# Patient Record
Sex: Female | Born: 1940 | Race: White | Hispanic: No | Marital: Single | Smoking: Never smoker
Health system: Southern US, Community
[De-identification: ages and names within clinical notes are randomized; demographics above are authoritative.]

## PROBLEM LIST (undated history)

## (undated) DIAGNOSIS — F32A Depression, unspecified: Secondary | ICD-10-CM

## (undated) DIAGNOSIS — G4730 Sleep apnea, unspecified: Secondary | ICD-10-CM

## (undated) DIAGNOSIS — R413 Other amnesia: Secondary | ICD-10-CM

## (undated) DIAGNOSIS — E039 Hypothyroidism, unspecified: Secondary | ICD-10-CM

## (undated) DIAGNOSIS — F329 Major depressive disorder, single episode, unspecified: Secondary | ICD-10-CM

## (undated) DIAGNOSIS — M1990 Unspecified osteoarthritis, unspecified site: Secondary | ICD-10-CM

## (undated) DIAGNOSIS — N189 Chronic kidney disease, unspecified: Secondary | ICD-10-CM

## (undated) DIAGNOSIS — R51 Headache: Secondary | ICD-10-CM

## (undated) DIAGNOSIS — I639 Cerebral infarction, unspecified: Secondary | ICD-10-CM

## (undated) DIAGNOSIS — K219 Gastro-esophageal reflux disease without esophagitis: Secondary | ICD-10-CM

## (undated) DIAGNOSIS — F0390 Unspecified dementia without behavioral disturbance: Secondary | ICD-10-CM

## (undated) HISTORY — PX: CARPAL TUNNEL RELEASE: SHX101

## (undated) HISTORY — PX: FOOT SURGERY: SHX648

## (undated) HISTORY — PX: PET ALZHEIMER/DEMENTIA STUDY (ARMC HX): HXRAD1433

## (undated) HISTORY — PX: BACK SURGERY: SHX140

## (undated) HISTORY — PX: ROTATOR CUFF REPAIR: SHX139

---

## 1992-05-14 HISTORY — PX: ABDOMINAL HYSTERECTOMY: SHX81

## 1998-04-13 ENCOUNTER — Ambulatory Visit (HOSPITAL_COMMUNITY): Admission: RE | Admit: 1998-04-13 | Discharge: 1998-04-13 | Payer: Self-pay | Admitting: Orthopedic Surgery

## 1999-08-03 ENCOUNTER — Encounter: Payer: Self-pay | Admitting: Orthopedic Surgery

## 1999-08-10 ENCOUNTER — Ambulatory Visit (HOSPITAL_COMMUNITY): Admission: RE | Admit: 1999-08-10 | Discharge: 1999-08-10 | Payer: Self-pay | Admitting: Orthopedic Surgery

## 2000-10-01 ENCOUNTER — Encounter: Admission: RE | Admit: 2000-10-01 | Discharge: 2000-10-01 | Payer: Self-pay | Admitting: Orthopedic Surgery

## 2000-10-01 ENCOUNTER — Encounter: Payer: Self-pay | Admitting: Orthopedic Surgery

## 2002-11-06 ENCOUNTER — Encounter: Admission: RE | Admit: 2002-11-06 | Discharge: 2002-11-06 | Payer: Self-pay | Admitting: Orthopedic Surgery

## 2002-11-06 ENCOUNTER — Encounter: Payer: Self-pay | Admitting: Orthopedic Surgery

## 2002-12-09 ENCOUNTER — Encounter: Payer: Self-pay | Admitting: Orthopedic Surgery

## 2002-12-17 ENCOUNTER — Encounter: Payer: Self-pay | Admitting: Orthopedic Surgery

## 2002-12-17 ENCOUNTER — Inpatient Hospital Stay (HOSPITAL_COMMUNITY): Admission: RE | Admit: 2002-12-17 | Discharge: 2002-12-18 | Payer: Self-pay | Admitting: Orthopedic Surgery

## 2003-10-12 ENCOUNTER — Ambulatory Visit (HOSPITAL_BASED_OUTPATIENT_CLINIC_OR_DEPARTMENT_OTHER): Admission: RE | Admit: 2003-10-12 | Discharge: 2003-10-12 | Payer: Self-pay | Admitting: Orthopedic Surgery

## 2003-12-30 ENCOUNTER — Encounter: Admission: RE | Admit: 2003-12-30 | Discharge: 2003-12-30 | Payer: Self-pay | Admitting: Orthopedic Surgery

## 2004-02-17 ENCOUNTER — Observation Stay (HOSPITAL_COMMUNITY): Admission: RE | Admit: 2004-02-17 | Discharge: 2004-02-18 | Payer: Self-pay | Admitting: Orthopedic Surgery

## 2004-05-25 ENCOUNTER — Encounter: Admission: RE | Admit: 2004-05-25 | Discharge: 2004-05-25 | Payer: Self-pay | Admitting: Orthopedic Surgery

## 2004-05-27 ENCOUNTER — Encounter: Admission: RE | Admit: 2004-05-27 | Discharge: 2004-05-27 | Payer: Self-pay | Admitting: Orthopedic Surgery

## 2005-10-29 ENCOUNTER — Ambulatory Visit (HOSPITAL_COMMUNITY): Admission: RE | Admit: 2005-10-29 | Discharge: 2005-10-30 | Payer: Self-pay | Admitting: Orthopedic Surgery

## 2006-05-23 ENCOUNTER — Ambulatory Visit (HOSPITAL_COMMUNITY): Admission: RE | Admit: 2006-05-23 | Discharge: 2006-05-24 | Payer: Self-pay | Admitting: Orthopedic Surgery

## 2007-03-27 ENCOUNTER — Encounter: Admission: RE | Admit: 2007-03-27 | Discharge: 2007-03-27 | Payer: Self-pay | Admitting: Orthopedic Surgery

## 2009-05-14 HISTORY — PX: TOE SURGERY: SHX1073

## 2009-07-19 ENCOUNTER — Observation Stay (HOSPITAL_COMMUNITY): Admission: RE | Admit: 2009-07-19 | Discharge: 2009-07-20 | Payer: Self-pay | Admitting: Orthopedic Surgery

## 2010-07-04 ENCOUNTER — Ambulatory Visit (HOSPITAL_BASED_OUTPATIENT_CLINIC_OR_DEPARTMENT_OTHER)
Admission: RE | Admit: 2010-07-04 | Discharge: 2010-07-05 | Disposition: A | Discharge status: Home / Self Care | Payer: Medicare Other | Attending: Orthopedic Surgery | Admitting: Orthopedic Surgery

## 2010-07-04 DIAGNOSIS — M24119 Other articular cartilage disorders, unspecified shoulder: Secondary | ICD-10-CM | POA: Insufficient documentation

## 2010-07-04 DIAGNOSIS — M719 Bursopathy, unspecified: Secondary | ICD-10-CM | POA: Insufficient documentation

## 2010-07-04 DIAGNOSIS — M67919 Unspecified disorder of synovium and tendon, unspecified shoulder: Secondary | ICD-10-CM | POA: Insufficient documentation

## 2010-07-04 DIAGNOSIS — M25819 Other specified joint disorders, unspecified shoulder: Secondary | ICD-10-CM | POA: Insufficient documentation

## 2010-07-07 NOTE — Op Note (Signed)
NAME:  Maria Henry, Maria Henry               ACCOUNT NO.:  [id number]  MEDICAL RECORD NO.:  [id number]          PATIENT TYPE:  AMB  LOCATION:  [facility]                         FACILITY:  [facility]  PHYSICIAN:  Marlowe Kays, M.D.  DATE OF BIRTH:  06-Apr-1941  DATE OF PROCEDURE:  07/04/2010 DATE OF DISCHARGE:                              OPERATIVE REPORT   PREOPERATIVE DIAGNOSIS: 1. Labral tear. 2. Partial rotator cuff tear with impingement from distal clavicle.  POSTOPERATIVE DIAGNOSIS: 1. Labral tear. 2. Partial rotator cuff tear with impingement from distal clavicle.  OPERATION:  Right shoulder arthroscopy with: 1. Debridement of labral tears and minor debridement of biceps tendon. 2. Clearance of the subacromial adhesions with shaving of the rotator     cuff. 3. Distal clavicle decompression.  SURGEON:  Marlowe Kays, M.D.  ASSISTANT:  Mr. Idolina Primer, [facility].  ANESTHESIA:  General.  PLAN AND JUSTIFICATION OF PROCEDURE:  She has a history of years ago having had an open rotator cuff repair by me and recently has developed progressive right shoulder and arm pain with an MRI on April 24, 2010, demonstrating the above-mentioned pathology.  DESCRIPTION OF PROCEDURE:  Satisfied general anesthesia, she had been off her Coumadin now for 5 days and it was felt that her INR of 1.3 was satisfactory for surgery.  This was done yesterday.  Beach-chair position on the sling frame, the right shoulder was prepped with DuraPrep, draped in sterile field.  Anatomy of the shoulder joint was marked out.  Time-out performed.  Through a posterior soft spot portal atraumatically I entered the glenohumeral joint finding some significant fraying of the rotator cuff with fairly long strands impinging into the joint with some minor wear of the long head of biceps tendon.  Rotator cuff on the articular surface appeared to be relatively benign.  I then advanced the scope between the biceps tendon, subscapularis  and using switching stick and making an anterior incision, I placed a metal cannula over the switching stick and then used a 4.2 shaver in the joint debriding down the labral disruption and minimally shaving the long head of biceps tendon.  Pictures were taken.  I then directed the scope in the subacromial space and through a lateral portal introduced a 4.2 shaver.  She had a good bit of scar and I took some time to coordinate both the scope and the 4.2 shaver but this was eventually done.  Old Tycron sutures were noted.  Rotator cuff was basically intact but there was some significant fraying of the surface area and I shaved all this down as well as cleaned out adhesions in the subacromial space.  We then worked medially and we identified the distal clavicle and I used the ArthroCare 90-degree vaporizer to remove the soft tissue from the undersurface of it and pictures confirmed the impingement.  I then used a 4 mm oval bur to bur down the undersurface of the distal clavicle until there was no longer any impingement on the rotator cuff.  Final pictures were taken.  All fluid possible was removed from the subacromial space which was reinjected with 0.5% Marcaine with adrenaline as  well as the 3 portals which were closed with 4-0 nylon. Betadine, Adaptic, dry sterile dressing shoulder immobilizer applied. She tolerated the procedure well.  At time of dictation she was on her way to recovery room in satisfactory condition with no known complications.          ______________________________ Marlowe Kays, M.D.     JA/MEDQ  D:  07/04/2010  T:  07/04/2010  Job:  [id number]  Electronically Signed by Marlowe Kays M.D. on 07/07/2010 04:25:42 PM

## 2010-08-07 LAB — COMPREHENSIVE METABOLIC PANEL
ALT: 24 U/L (ref 0–35)
Albumin: 4 g/dL (ref 3.5–5.2)
CO2: 30 mEq/L (ref 19–32)
Chloride: 104 mEq/L (ref 96–112)
GFR calc non Af Amer: 38 mL/min — ABNORMAL LOW (ref >60)
Glucose, Bld: 75 mg/dL (ref 70–99)
Total Bilirubin: 0.9 mg/dL (ref 0.3–1.2)

## 2010-08-07 LAB — PROTIME-INR
INR: 1.03 (ref 0.00–1.49)
INR: 1.95 — ABNORMAL HIGH (ref 0.00–1.49)
Prothrombin Time: 13.4 seconds (ref 11.6–15.2)
Prothrombin Time: 22.1 seconds — ABNORMAL HIGH (ref 11.6–15.2)

## 2010-08-07 LAB — APTT: aPTT: 29 seconds (ref 24–37)

## 2010-09-29 NOTE — Op Note (Signed)
NAME:  Maria Henry, Maria Henry               ACCOUNT NO.:  [id number]   MEDICAL RECORD NO.:  [id number]          PATIENT TYPE:  AMB   LOCATION:  DAY                          FACILITY:  [facility]   PHYSICIAN:  Marlowe Kays, M.D.  DATE OF BIRTH:  July 26, 1940   DATE OF PROCEDURE:  02/17/2004  DATE OF DISCHARGE:                                 OPERATIVE REPORT   PREOPERATIVE DIAGNOSIS:  Small recurrent rotator cuff tear right shoulder.   POSTOPERATIVE DIAGNOSIS:  Chronic impingement syndrome, with small recurrent  rotator cuff tear right shoulder.   OPERATIONS:  1.  Right shoulder arthroscopy (normal examination).  2.  Arthroscopic subacromial resection, as well as resection of distal      inferior clavicle.   SURGEON:  Marlowe Kays, M.D.   ASSISTANTDruscilla Brownie. Idolina Primer, P.A.-C.   ANESTHESIA:  General.   PATHOLOGIST:  __________ .   INDICATIONS FOR PROCEDURE:  I originally performed an open rotator cuff  repair in 1997.  She recently has developed progressive pain and some  weakness in the shoulder.  We had previously had an MRI which was affected  by metallic artifact, and arthrogram has demonstrated a small amount of dye  only on post-exercise films going to the subacromial space.  It was  explained to her preoperatively that we would try and do everything  arthroscopically, and hopefully with a small tear that we would find some  residual impingement, and that relieving this would allow the tear to heal.  See operative description below for additional details of pathology.   PROCEDURE:  Satisfactory general anesthesia, beach chair position on the  sliding frame.  Right shoulder girdle was prepped with Duraprep, draped in a  sterile field.  The shoulder joint was marked out.  Lateral, posterior, soft  spot, and subacromial areas were all infiltrated with 0.5% Marcaine with  adrenaline.  Through a posterior soft spot portal, I atraumatically entered  the glenohumeral joint.  This  was normal on examination.  Representative  pictures were taken.  In particular, the undersurface of the rotator cuff  appeared to be intact.  I then redirected the scope to the subacromial area.  There were adhesions present, stirring up a little bleeding, which I  corrected after removing many of the adhesions with the 4.2 shaver with the  ArthroCare vaporizer.  On inspecting the subacromial joint, there was a  projection of bone over the anterior leading edge of the acromion, but  perhaps the most prominent spurring was of the distal inferior clavicle.  After using the vaporizer to clear soft tissue from these areas, I then used  a 4.0 oval bur to smooth all this down.  I then debrided it off the rotator  cuff with a 4.2 shaver.  At the conclusion of the case, we could not find  any rotator cuff tear, and there was complete release of any impingement  from the distal clavicle and the anterior acromion.  Representative pictures  were taken with the arm to her side and the arm abducted.  Also, subacromial  bleeding was corrected at the time we  completed the case.  All fluid  possible was removed.  The two portals were closed with interrupted 4-0  nylon and reinjected with the Marcaine with adrenaline,  as well as the subacromial space.  __________  Adaptic, dry sterile  dressing, and shoulder immobilizer were applied.  She tolerated the  procedure well and was taken to the recovery room in satisfactory condition  without complications.      JA/MEDQ  D:  02/17/2004  T:  02/17/2004  Job:  [id number]

## 2010-09-29 NOTE — H&P (Signed)
NAME:  Maria Henry, Maria Henry                         ACCOUNT NO.:  [id number]   MEDICAL RECORD NO.:  [id number]                   PATIENT TYPE:  INP   LOCATION:  NA                                   FACILITY:  [facility]   PHYSICIAN:  Marlowe Kays, M.D.               DATE OF BIRTH:  [birth year]   DATE OF ADMISSION:  12/17/2002  DATE OF DISCHARGE:                                HISTORY & PHYSICAL   CHIEF COMPLAINT:  Pain in my back and leg.   HISTORY OF PRESENT ILLNESS:  [name] had been seen by  [name] for continuing progressive problems concerning pain in her low  back.  She has had numbness and tingling in both legs with jerking in her  legs.  It was thought that perhaps this may be a foot problem.  [name],  a podiatrist, made orthotics for her feet but unfortunately, she has not  been relieved with her discomfort.  She had a high indication of spinal  stenosis on examination.   A myelogram was performed at [facility] and results  showed a soft disk protrusion at L4-5 which results in mild canal stenosis  and bilateral L5 nerve encroachment, worse on the left.  Stenotic changes  were seen as well at L3-L4.  After much discussion and the fact that this  patient is a relatively young lady who is finding her pain interfering with  her daily activities, it was decided to go ahead with central decompression  with lumbar laminectomy  L3-L4 and L4-L5.   PAST MEDICAL HISTORY:  This lady's physician is Dr. Daryll Brod of [facility] in [facility] [place].  The patient has some elevated liver enzymes  preoperatively.  Dr. Daryll Brod noted that she was on Lipitor and will follow  up with that.   ALLERGIES:  The patient has no medical allergies.   CURRENT MEDICATIONS:  1. Prilosec 20 mg one daily.  2. Effexor XR 150 mg daily.  3. Lipitor 20 mg daily.  4. She had been on Coumadin 5 mg daily secondary to history of CVAs without  sequelae and has stopped it five days prior to surgery.  5. Vioxx 50 mg daily.  6. __________.  7. Folic acid 1 mg daily.  8. Calcium.  9. Extra strength Tylenol p.r.n.  10.      Centrum.  11.      Glucosamine chondroitin.  12.      She is hypothyroid and takes Synthroid.   Her stroke occurred in February 1997.  She has no sequelae and no residual  problem.  She has anxiety/depression which is treated with Effexor.  She has  a questionable hiatal hernia for which she takes the Prilosec.  She has  urinary incontinence on stress.  Her thyroid oblation was done some years  ago.   PAST SURGICAL HISTORY:  1. Hysterectomy in  1994.  2. Carpal tunnel bilaterally in the past.  3. Bilateral rotator cuff repairs.  4. Hammer toe corrections in bilateral feet.  5. Arthroscopy of the left knee.   FAMILY HISTORY:  Positive for heart disease in the mother and hypertension  seen in the mother, sister, and brother.  Her sister has diabetes.  Family  history of lung and stomach cancer.  Mother also had a history of CVAs as  well as arthritis.   SOCIAL HISTORY:  The patient is single.  Has a strong family support group.  She is an Programmer, systems.  She has no intake of alcohol or tobacco products.  Lives in a two story home.  Her family physician is Dr. Daryll Brod of  [facility], [place].   REVIEW OF SYSTEMS:  CENTRAL NERVOUS SYSTEM:  No seizures, shoulder  paralysis, numbness, double vision other than in present illness with  numbness in the lower extremities.  CARDIOVASCULAR:  No chest pain.  No  angina.  No orthopnea.  PULMONARY:  No productive cough.  No hemoptysis.  No  shortness of breath.  GASTROINTESTINAL:  No nausea, vomiting, melena, bloody  stools.  GENITOURINARY:  No discharge, dysuria, hematuria.  She does have  stress incontinence.  MUSCULOSKELETAL:  Primarily in present illness.   PHYSICAL EXAMINATION:  GENERAL:  Alert, cooperative, friendly 70 year old  white  female who is accompanied by her sisters and grandson.  VITAL SIGNS:  Blood pressure 120/68, pulse 62, respirations 12.  HEENT:  Normocephalic.  PERRLA.  EOM intact.  Oropharynx is clear.  CHEST:  Clear to auscultation.  No rhonchi.  No rales.  HEART:  Regular rate and rhythm.  No murmurs are heard.  ABDOMEN:  Slightly obese, soft, nontender.  Liver, spleen not felt.  GENITALIA, RECTAL, PELVIC, and BREASTS:  Not done.  Not pertinent to present  illness.  EXTREMITIES:  There are no deformities.  There is a generalized numbness in  the lower extremities.   ADMITTING DIAGNOSES:  1. Spinal stenosis L4-L5 with disk protrusion at L3-L4.  2. Chronic depression.  3. History of cerebrovascular accident on Coumadin therapy.  4. Questionable hiatal hernia.  5. Urinary incontinence.  6. Hypothyroidism secondary to thyroid oblation.   PLAN:  The patient will undergo central decompressive lumbar laminectomy at  L3-L4 and L4-L5.  Dr. Darrelyn Hillock has planned to assist.  Should we have any  medical problems, we will certainly ask one of our internal medicine  physicians here in [facility] to consult with [place].  Per the fax from Dr.  Daryll Brod, he is going to discontinue Lipitor and follow after surgery.     Lottie Dawson, P.A.-C              Marlowe Kays, M.D.   DLU/MEDQ  D:  12/16/2002  T:  12/16/2002  Job:  [id number]   cc:   Elana Alm., M.D., [facility]

## 2010-09-29 NOTE — Op Note (Signed)
NAME:  Maria Henry, Maria Henry                         ACCOUNT NO.:  [id number]   MEDICAL RECORD NO.:  [id number]                   PATIENT TYPE:  INP   LOCATION:  NA                                   FACILITY:  [facility]   PHYSICIAN:  Marlowe Kays, M.D.               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   OPERATION:  Central decompressive laminectomy L3-4, L4-5 and L5-S1 right.   SURGEON:  Marlowe Kays, M.D.   ASSISTANT:  Georges Lynch. Darrelyn Hillock, M.D.   ANESTHESIA:  General.   PATHOLOGY AND JUSTIFICATION FOR PROCEDURE:  Back and bilateral leg pain with  tingling in both legs. Myelogram CT scan showing some bulging disks at both  L3-4 and L4-5 but no herniation. She has 4 mm of instability at L4-5 of her  degenerative spondylolisthesis on flexion extension films but at age 25 and  with health somewhat fragile it was not felt that a fusion would immediately  be indicated and that the above mentioned surgery was preferred. The plan  was since she had spinal stenosis on the myelogram at L3-4 and L4-5 as well  as poor filling S1 nerve root on the right to preform the above mentioned  surgery.   DESCRIPTION OF PROCEDURE:  She had been taken off her Coumadin and was felt  ready for surgery. Prophylactic antibiotics, satisfactory general  anesthesia, Foley catheter inserted, knee chest position on the Andrews  frame, back was prepped with duraprep, draped in a sterile field. With three  spinal needles and a lateral x-ray, I was able to determine initial point of  incision and after  isolating the two spinous processes which we thought were  L3, L4 and L5, I clamped what I thought was L4 and L5 with Kocher clamps and  took a second lateral x-ray confirming that we were indeed in L4 and L5. I  then continued the dissection removing soft tissue from L3 down pass L5 on  the right and placed self retaining McCullough retractors. With the double  action rongeur, I removed the spinous process of L4 and a portion of L5 as  well as inferior portion of L3. Continued removing the neural arch of L4 but  found that she had dense adhesion of ligamentum flavum through the dura at  the L4-5 interval and consequently moved up to L3-4 and I was able to work  carefully with Kerrison rongeurs removing central bone and then working  laterally working up underneath L3 until we had thoroughly decompressed the  dura and then continued distally. At this point, we brought in the  microscope to improve our visualization and because of concern we had with  the ligamentum flavum being so adherent to the dura. Concerns were well  founded working laterally and distally on both sides. We left the central  portion at L4-5 because of adhesions until the last bit. Decompressed on the  right both the L5 and S1 nerve roots and on the left the L5 nerve root. We  were gradually able to tease ligamentum flavum off  the dura. There was a  membranous type stalk which we sectioned well pass the dura and removed the  remaining portion of ligamentum flavum without any injury to the dura. At  the conclusion of the case, the decompressed area was felt to be thoroughly  decompressed with iatrogenic injury.  The wound was irrigated well with  sterile saline and dura covered with Gelfoam. I then placed a 1/4 inch drain  through the left posterior flank and carefully reapproximated the wound  around it with interrupted #1 Vicryl in the fascia and deep subcutaneous  tissue, 2-0 Vicryl in the superficial subcutaneous  tissue and staples in the  skin. Betadine Adaptic dry sterile dressing were applied. She tolerated the  procedure well and was taken to the recovery room in satisfactory condition  with no known complications. Estimated blood loss was approximately 300 mL,  no blood replacement.                                               Marlowe Kays, M.D.    JA/MEDQ  D:  12/17/2002  T:  12/17/2002  Job:  [id number]

## 2010-09-29 NOTE — Op Note (Signed)
NAME:  Maria Henry, Maria Henry               ACCOUNT NO.:  [id number]   MEDICAL RECORD NO.:  [id number]          PATIENT TYPE:  AMB   LOCATION:  DAY                          FACILITY:  [facility]   PHYSICIAN:  Marlowe Kays, M.D.  DATE OF BIRTH:  18-May-1940   DATE OF PROCEDURE:  05/23/2006  DATE OF DISCHARGE:                               OPERATIVE REPORT   PREOPERATIVE DIAGNOSIS:  Painful anterior medial right ankle secondary  to impingement of the talus and medial malleolus with possible old  fracture deformity of tip of medial malleolus.   POSTOPERATIVE DIAGNOSIS:  Painful anterior medial right ankle secondary  to impingement of the talus and medial malleolus with possible old  fracture deformity of tip of medial malleolus.   OPERATION:  Medial ankle arthrotomy with debridement of adjacent sides  of the medial malleolus and talus.   SURGEON:  Marlowe Kays, M.D.   ASSISTANTDruscilla Brownie. Cherlynn June.   ANESTHESIA:  General.   PATHOLOGY AND JUSTIFICATION FOR PROCEDURE:  She has been very physically  active in past years.  Denies any known fracture to her ankle.  Over the  last several months has had pain which localizes to the anterior medial  ankle joint.  A standing AP x-ray of both ankles demonstrates a slight  similar deformity in the left ankle but not nearly as pronounced in the  right ankle.  She has a curved tip of the medial malleolus digging into  the talus, which has the appearance of an old malunion or fibrous union  of fracture.  I tried injecting her in area of point tenderness with  steroid and Xylocaine with temporary relief only from her symptoms, but  the Xylocaine did relieve her symptoms and she is here today for the  above-mentioned surgery.   PROCEDURE:  Satisfactory general anesthesia, pneumatic tourniquet, right  lower extremity was esmarched out nonsterilely and prepped from midcalf  to toes with DuraPrep and draped in a sterile field.  I made a vertical  anterior medial joint incision and protecting neurovascular structures  entered down through the deltoid ligament into the joint.  I carried the  incision distally to observe the posterior tibial tendon, which was  protected.  There appeared to be a small aperture in the canal for the  posterior tibial tendon with a good bit of fluid in the interval between  the talus and the medial malleolus, which was abnormal in amount but not  in appearance.  This seemed to confirm chronic irritation in this area.  With a combination of curette, small rongeur and Freer elevator, I began  clearing out the interval between the talus and the inner aspect of the  medial malleolus, working back into the posterior joint on direct  visualization.  She then had a wide opening between the two bones.  I  took a confirmatory C-arm AP x-ray, which showed wide clearance.  The  wound was then irrigated with sterile saline.  The deltoid ligament  capsular layer was closed with interrupted #1 Vicryl after injecting the  joint and soft tissues with 0.5% plain Marcaine.  I then continued to  close with interrupted 2-0 Vicryl subcutaneous tissue and interrupted 4-  0 nylon mattress sutures in the  skin.  Betadine, Adaptic dry sterile dressing and short leg splint cast  was applied.  The tourniquet was released.  She tolerated the procedure  well was taken to the recovery room in satisfactory condition with no  known complications.           ______________________________  Marlowe Kays, M.D.     JA/MEDQ  D:  05/23/2006  T:  05/23/2006  Job:  [id number]

## 2010-09-29 NOTE — Op Note (Signed)
NAME:  Maria Henry, Maria Henry               ACCOUNT NO.:  [id number]   MEDICAL RECORD NO.:  [id number]          PATIENT TYPE:  INP   LOCATION:  1610                         FACILITY:  [facility]   PHYSICIAN:  Marlowe Kays, M.D.  DATE OF BIRTH:  August 06, 1940   DATE OF PROCEDURE:  10/29/2005  DATE OF DISCHARGE:                                 OPERATIVE REPORT   PREOPERATIVE DIAGNOSES:  1.  Torn medial lateral menisci.  2.  Osteoarthritis left knee.   POSTOPERATIVE DIAGNOSES:  1.  Torn medial lateral menisci.  2.  Osteoarthritis left knee.   OPERATION:  Left knee arthroscopy with partial medial meniscectomy and  debridement of medial lateral femoral condyle.   ANESTHESIA:  General.   FINDINGS UNDER ANESTHESIA:  Painful left knee with x-rays demonstrating  degenerative arthritis but MRI demonstrating anterior horn tear of the  lateral meniscus and a posterior horn tear of the medial meniscus.  These  findings were confirmed at surgery.   PROCEDURE:  She had been taken off her Coumadin will be covered  postoperatively with Lovenox, also given prophylactic antibiotics, pneumatic  tourniquet applied and the left leg Esmarched out nonsterilely.  A thigh  stabilizer applied.  Ace wrap and knee support to right knee.  The left leg  was prepped from tourniquet to ankle with DuraPrep and draped as a sterile  field with superior medial saline inflow.  First an anterolateral portal  medial compartment joint was evaluated.  She had a severe macerated tear of  the entire anterior third to half of the lateral meniscus.  I managed this  mainly with scissors and 3.5 shaver cutting out the bucket-handle type tear  and the flap created by resection and then smoothing the meniscus down with  mini basket and 3.5 shaver until the remaining meniscus was stable on  probing.  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  The lateral tibial plateau also had  wear  but did not require shaving.  Looking at the lateral suprapatellar area she  had some wear of her patella but nothing that was able to be shaved.  I then  reversed portals.  She had full-thickness wear of the medial tibial plateau  and grade 2 to 3/4 wear of the medial femoral condyle which I smoothed down.  There was a wrinkle of the posterior curve which on first blush appeared to  be a tear but on straightening out the meniscus with a probe this was found  be a fold.  She did have a tear of the intercondylar area of the meniscus,  however, where there was almost no room available for working.  I used a  smallest baskets possible to resect this torn area and tried to shave it  down to smooth as much as possible with the 3.5 shaver.  The knee joint was  then irrigated to clear and all fluid possible removed.  The ports were  closed with 4-0 nylon, 20 mL  of 0.5% Marcaine  with adrenalin but no morphine was then instilled through the  inflow  apparatus which was then closed with 4-0 nylon as well.  Betadine and  dressing were applied.  Tourniquet was released.  At the time of this  dictation she was on her way to the recovery room in satisfactory condition  with no complications.           ______________________________  Marlowe Kays, M.D.     JA/MEDQ  D:  10/29/2005  T:  10/30/2005  Job:  [id number]

## 2010-09-29 NOTE — Op Note (Signed)
NAME:  Maria Henry, Maria Henry                         ACCOUNT NO.:  [id number]   MEDICAL RECORD NO.:  [id number]                   PATIENT TYPE:  AMB   LOCATION:  [facility]                                 FACILITY:  [facility]   PHYSICIAN:  Marlowe Kays, M.D.               DATE OF BIRTH:  30-Jun-1940   DATE OF PROCEDURE:  10/12/2003  DATE OF DISCHARGE:                                 OPERATIVE REPORT   PREOPERATIVE DIAGNOSIS:  Possible torn medial meniscus and degenerative  arthritis left knee.   POSTOPERATIVE DIAGNOSES:  1. Torn medial and lateral menisci.  2. Degenerative arthritis of the left knee.   OPERATION PERFORMED:  Left knee arthroscopy with (1) partial medial and  lateral meniscectomy, (2) shaving of medial femoral condyle, lateral femoral  condyle lateral tibial plateau.   SURGEON:  Marlowe Kays, M.D.   ASSISTANT:  Nurse.   ANESTHESIA:  General.   INDICATIONS FOR PROCEDURE:  The patient has had a long history of problems  with her left knee.  In 1999 I arthroscoped her left knee.  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  Accordingly,  she is here today for the above mentioned surgery.  It will be determined at  surgery whether or not she has torn menisci.  See operative description  below for pathology.   DESCRIPTION OF PROCEDURE:  Satisfactory general anesthesia, pneumatic  tourniquet.  The leg was Esmarched out nonsterilely and thigh stabilizer  utilized with Ace wrap and protective pad on the right knee.  The left leg  was prepped with DuraPrep and draped  into a sterile field, superomedial  saline inflow.  First through an anterolateral portal medial compartment  knee joint was evaluated.  She had a little bit of roughening of the  anterior third of the medial meniscus which I shaved down.  The medial  femoral condyle really not look terribly worn and only required a little bit  of smoothing  down.  There was no full thickness chondromalacia.  She did,  however, have an area of full thickness wear through the articular cartilage  of the posteromedial tibial plateau and adjacent to that was an area where  she had a serrated type tear almost as if a mouse had eaten out chunks and  also tear into the intercondylar area.  All of this was pictured and taken  back to a stable rim with a combination of baskets and 3.5 shaver.  Once  small area of what appeared to be some persistent potential area for  bleeding I cauterized with the underwater Bovie.  Looking at the medial  gutter and suprapatellar area, the patella showed minimal wear.  I then  reversed portals.  She had a tear of the anterior and lateral portions of  the lateral meniscus which I pictured and shaved on down with a combination  of scissors and the 3.5 shaver.  She had grade 2 to 3/4 chondromalacia of  both the lateral femoral condyle and lateral tibial plateau which I also  smoothed down with the shaver.  The knee joint was then irrigated until  clear and all _________ removed.  The two anterior portals were closed with  4-0 nylon.  20 mL of 0.5%  Marcaine with Adrenalin was instilled through the inflow apparatus which was  removed and this portal closed with 4-0 nylon as well.  Betadine, Adaptic  and dry sterile dressing were applied.  Tourniquet was released.  The  patient tolerated the procedure well and was taken to recovery in  satisfactory condition with no complications.                                               Marlowe Kays, M.D.    JA/MEDQ  D:  10/12/2003  T:   10/12/2003  Job:  [id number]

## 2012-06-18 NOTE — Progress Notes (Signed)
Dr Simonne Come-  Need Pre Op Orders when you can- pt has appt PST 06/23/12  [facility]

## 2012-06-19 ENCOUNTER — Other Ambulatory Visit: Payer: Self-pay | Admitting: Orthopedic Surgery

## 2012-06-19 ENCOUNTER — Encounter (HOSPITAL_COMMUNITY): Payer: Self-pay | Admitting: Pharmacy Technician

## 2012-06-23 ENCOUNTER — Encounter (HOSPITAL_COMMUNITY): Payer: Self-pay

## 2012-06-23 ENCOUNTER — Encounter (HOSPITAL_COMMUNITY)
Admission: RE | Admit: 2012-06-23 | Discharge: 2012-06-23 | Disposition: A | Discharge status: Home / Self Care | Payer: Medicare PPO | Source: Ambulatory Visit | Attending: Orthopedic Surgery | Admitting: Orthopedic Surgery

## 2012-06-23 HISTORY — DX: Headache: R51

## 2012-06-23 HISTORY — DX: Cerebral infarction, unspecified: I63.9

## 2012-06-23 HISTORY — DX: Unspecified osteoarthritis, unspecified site: M19.90

## 2012-06-23 HISTORY — DX: Gastro-esophageal reflux disease without esophagitis: K21.9

## 2012-06-23 HISTORY — DX: Major depressive disorder, single episode, unspecified: F32.9

## 2012-06-23 HISTORY — DX: Hypothyroidism, unspecified: E03.9

## 2012-06-23 HISTORY — DX: Depression, unspecified: F32.A

## 2012-06-23 LAB — CBC
HCT: 40.1 % (ref 36.0–46.0)
Hemoglobin: 13.7 g/dL (ref 12.0–15.0)
MCH: 31.3 pg (ref 26.0–34.0)
Platelets: 302 10*3/uL (ref 150–400)
RBC: 4.38 MIL/uL (ref 3.87–5.11)
RDW: 13 % (ref 11.5–15.5)

## 2012-06-23 LAB — BASIC METABOLIC PANEL
Calcium: 9.8 mg/dL (ref 8.4–10.5)
GFR calc Af Amer: 50 mL/min — ABNORMAL LOW (ref >90)
Potassium: 4.3 mEq/L (ref 3.5–5.1)

## 2012-06-23 LAB — SURGICAL PCR SCREEN: MRSA, PCR: NEGATIVE

## 2012-06-23 NOTE — Patient Instructions (Addendum)
Maria Henry  06/23/2012   Your procedure is scheduled on: 06/26/12  Report to [facility] at 12:00 PM.  Call this number if you have problems the morning of surgery 336-: [phone]   Remember:   Do not eat food After Midnight on 06/25/12 then clear liquids from midnight until 0830am on 06/26/12.     Take these medicines the morning of surgery with A SIP OF WATER: synthroid, paxil, prilosec, tramadol if needed   Do not wear jewelry, make-up or nail polish.  Do not wear lotions, powders, or perfumes. You may wear deodorant.  Do not shave 48 hours prior to surgery. Men may shave face and neck.  Do not bring valuables to the hospital.  Contacts, dentures or bridgework may not be worn into surgery.     Patients discharged the day of surgery will not be allowed to drive home.  Name and phone number of your driver: Dennie Bible [phone]     Please read over the following fact sheets that you were given: MRSA Information.  Birdie Sons, RN  pre op nurse call if needed [phone]    FAILURE TO FOLLOW THESE INSTRUCTIONS MAY RESULT IN CANCELLATION OF YOUR SURGERY   Patient Signature: ___________________________________________

## 2012-06-23 NOTE — Progress Notes (Signed)
Surgery clearance note Dr, Ala Dach on chart

## 2012-06-26 ENCOUNTER — Encounter (HOSPITAL_COMMUNITY): Admission: RE | Payer: Self-pay | Source: Ambulatory Visit

## 2012-06-26 ENCOUNTER — Ambulatory Visit (HOSPITAL_COMMUNITY): Admission: RE | Admit: 2012-06-26 | Payer: Medicare PPO | Source: Ambulatory Visit | Admitting: Orthopedic Surgery

## 2012-06-26 SURGERY — ARTHROSCOPY, KNEE, WITH MEDIAL MENISCECTOMY
Anesthesia: General | Site: Knee | Laterality: Right

## 2012-06-30 NOTE — Progress Notes (Signed)
PT'S SURGERY FOR 06/26/12 WAS CANCELLED DUE TO SNOW--PT NOTIFIED HER SURGERY IS NOW RESCHEDULED FOR TOMORROW  07/01/12 AT 2:30 PM AND SHE IS TO ARRIVE TO SHORT STAY BY 12:00 PM.  NO FOOD AFTER MIDNIGHT TONIGHT--SHE MAY HAVE CLEAR LIQUIDS TO DRINK UNTIL 8:30 AM TOMORROW AND THEN NOTHING AFTER 8:30 AM.  ALL OTHER INSTRUCTIONS THE SAME AS PREVIOUSLY DISCUSSED -INCLUDING ANY MEDICATIONS TO TAKE.

## 2012-07-01 ENCOUNTER — Encounter (HOSPITAL_COMMUNITY): Payer: Self-pay | Admitting: Anesthesiology

## 2012-07-01 ENCOUNTER — Ambulatory Visit (HOSPITAL_COMMUNITY): Payer: Medicare PPO | Admitting: Anesthesiology

## 2012-07-01 ENCOUNTER — Encounter (HOSPITAL_COMMUNITY)
Admission: RE | Disposition: A | Discharge status: Home / Self Care | Payer: Self-pay | Source: Ambulatory Visit | Attending: Orthopedic Surgery

## 2012-07-01 ENCOUNTER — Encounter (HOSPITAL_COMMUNITY): Payer: Self-pay | Admitting: *Deleted

## 2012-07-01 ENCOUNTER — Ambulatory Visit (HOSPITAL_COMMUNITY)
Admission: RE | Admit: 2012-07-01 | Discharge: 2012-07-01 | Disposition: A | Discharge status: Home / Self Care | Payer: Medicare PPO | Source: Ambulatory Visit | Attending: Orthopedic Surgery | Admitting: Orthopedic Surgery

## 2012-07-01 DIAGNOSIS — M1710 Unilateral primary osteoarthritis, unspecified knee: Secondary | ICD-10-CM | POA: Insufficient documentation

## 2012-07-01 DIAGNOSIS — Z01810 Encounter for preprocedural cardiovascular examination: Secondary | ICD-10-CM | POA: Insufficient documentation

## 2012-07-01 DIAGNOSIS — M23305 Other meniscus derangements, unspecified medial meniscus, unspecified knee: Secondary | ICD-10-CM | POA: Insufficient documentation

## 2012-07-01 DIAGNOSIS — M23349 Other meniscus derangements, anterior horn of lateral meniscus, unspecified knee: Secondary | ICD-10-CM | POA: Insufficient documentation

## 2012-07-01 DIAGNOSIS — Z01812 Encounter for preprocedural laboratory examination: Secondary | ICD-10-CM | POA: Insufficient documentation

## 2012-07-01 DIAGNOSIS — Z9889 Other specified postprocedural states: Secondary | ICD-10-CM

## 2012-07-01 DIAGNOSIS — M2340 Loose body in knee, unspecified knee: Secondary | ICD-10-CM | POA: Insufficient documentation

## 2012-07-01 HISTORY — PX: KNEE ARTHROSCOPY: SHX127

## 2012-07-01 SURGERY — ARTHROSCOPY, KNEE
Anesthesia: General | Site: Knee | Laterality: Right | Wound class: Clean

## 2012-07-01 MED ORDER — LIDOCAINE HCL (CARDIAC) 20 MG/ML IV SOLN
INTRAVENOUS | Status: DC | PRN
  Administered 2012-07-01: 50 mg via INTRAVENOUS

## 2012-07-01 MED ORDER — BUPIVACAINE-EPINEPHRINE 0.5% -1:200000 IJ SOLN
INTRAMUSCULAR | Status: DC | PRN
  Administered 2012-07-01: 30 mL

## 2012-07-01 MED ORDER — MORPHINE SULFATE 4 MG/ML IJ SOLN
INTRAMUSCULAR | Status: AC
  Filled 2012-07-01: qty 1

## 2012-07-01 MED ORDER — FENTANYL CITRATE 0.05 MG/ML IJ SOLN: 25.0000 ug | INTRAMUSCULAR | Status: DC | PRN

## 2012-07-01 MED ORDER — LACTATED RINGERS IV SOLN: INTRAVENOUS | Status: DC

## 2012-07-01 MED ORDER — LACTATED RINGERS IR SOLN
Status: DC | PRN
  Administered 2012-07-01: 600 mL

## 2012-07-01 MED ORDER — PROMETHAZINE HCL 25 MG/ML IJ SOLN: 6.2500 mg | INTRAMUSCULAR | Status: DC | PRN

## 2012-07-01 MED ORDER — HYDROCODONE-ACETAMINOPHEN 7.5-325 MG PO TABS
1.0000 | ORAL_TABLET | Freq: Four times a day (QID) | ORAL | Status: DC | PRN
  Administered 2012-07-01: 1 via ORAL

## 2012-07-01 MED ORDER — ONDANSETRON HCL 4 MG/2ML IJ SOLN
INTRAMUSCULAR | Status: DC | PRN
  Administered 2012-07-01: 4 mg via INTRAVENOUS

## 2012-07-01 MED ORDER — HYDROCODONE-ACETAMINOPHEN 7.5-325 MG PO TABS
ORAL_TABLET | ORAL | Status: AC
  Administered 2012-07-01: 1 via ORAL
  Filled 2012-07-01: qty 1

## 2012-07-01 MED ORDER — HYDROCODONE-ACETAMINOPHEN 7.5-325 MG PO TABS: 1.0000 | ORAL_TABLET | Freq: Four times a day (QID) | ORAL | Status: DC | PRN

## 2012-07-01 MED ORDER — FENTANYL CITRATE 0.05 MG/ML IJ SOLN
INTRAMUSCULAR | Status: DC | PRN
  Administered 2012-07-01 (×3): 25 ug via INTRAVENOUS

## 2012-07-01 MED ORDER — MEPERIDINE HCL 50 MG/ML IJ SOLN: 6.2500 mg | INTRAMUSCULAR | Status: DC | PRN

## 2012-07-01 MED ORDER — LACTATED RINGERS IV SOLN
INTRAVENOUS | Status: DC | PRN
  Administered 2012-07-01 (×2): via INTRAVENOUS

## 2012-07-01 MED ORDER — EPINEPHRINE HCL 1 MG/ML IJ SOLN
INTRAMUSCULAR | Status: AC
  Filled 2012-07-01: qty 2

## 2012-07-01 MED ORDER — EPINEPHRINE HCL 1 MG/ML IJ SOLN
INTRAMUSCULAR | Status: DC | PRN
  Administered 2012-07-01: 2 mL

## 2012-07-01 MED ORDER — PROPOFOL 10 MG/ML IV BOLUS
INTRAVENOUS | Status: DC | PRN
  Administered 2012-07-01: 200 mg via INTRAVENOUS

## 2012-07-01 MED ORDER — BUPIVACAINE-EPINEPHRINE 0.5% -1:200000 IJ SOLN
INTRAMUSCULAR | Status: AC
  Filled 2012-07-01: qty 1

## 2012-07-01 MED ORDER — POVIDONE-IODINE 7.5 % EX SOLN: Freq: Once | CUTANEOUS | Status: DC

## 2012-07-01 SURGICAL SUPPLY — 26 items
BANDAGE ELASTIC 6 VELCRO ST LF (GAUZE/BANDAGES/DRESSINGS) ×2 IMPLANT
BLADE 4.2CUDA (BLADE) IMPLANT
BLADE CUDA SHAVER 3.5 (BLADE) ×2 IMPLANT
CLOTH BEACON ORANGE TIMEOUT ST (SAFETY) ×2 IMPLANT
CUFF TOURN SGL QUICK 34 (TOURNIQUET CUFF) ×1
CUFF TRNQT CYL 34X4X40X1 (TOURNIQUET CUFF) ×1 IMPLANT
DRSG EMULSION OIL 3X3 NADH (GAUZE/BANDAGES/DRESSINGS) ×2 IMPLANT
DRSG PAD ABDOMINAL 8X10 ST (GAUZE/BANDAGES/DRESSINGS) ×2 IMPLANT
DURAPREP 26ML APPLICATOR (WOUND CARE) ×2 IMPLANT
ELECT REM PT RETURN 9FT ADLT (ELECTROSURGICAL) ×2
ELECTRODE REM PT RTRN 9FT ADLT (ELECTROSURGICAL) ×1 IMPLANT
GLOVE BIO SURGEON STRL SZ7.5 (GLOVE) ×2 IMPLANT
GLOVE BIO SURGEON STRL SZ8 (GLOVE) ×4 IMPLANT
GLOVE ECLIPSE 8.0 STRL XLNG CF (GLOVE) ×6 IMPLANT
GLOVE INDICATOR 8.0 STRL GRN (GLOVE) ×4 IMPLANT
MANIFOLD NEPTUNE II (INSTRUMENTS) ×4 IMPLANT
PACK ARTHROSCOPY WL (CUSTOM PROCEDURE TRAY) ×2 IMPLANT
PAD CAST 4YDX4 CTTN HI CHSV (CAST SUPPLIES) ×1 IMPLANT
PADDING CAST COTTON 4X4 STRL (CAST SUPPLIES) ×1
POSITIONER SURGICAL ARM (MISCELLANEOUS) ×2 IMPLANT
SET ARTHROSCOPY TUBING (MISCELLANEOUS) ×1
SET ARTHROSCOPY TUBING LN (MISCELLANEOUS) ×1 IMPLANT
SPONGE GAUZE 4X4 12PLY (GAUZE/BANDAGES/DRESSINGS) ×2 IMPLANT
SUT ETHILON 4 0 PS 2 18 (SUTURE) ×2 IMPLANT
WAND 90 DEG TURBOVAC W/CORD (SURGICAL WAND) ×2 IMPLANT
WRAP KNEE MAXI GEL POST OP (GAUZE/BANDAGES/DRESSINGS) ×2 IMPLANT

## 2012-07-01 NOTE — Anesthesia Preprocedure Evaluation (Signed)
Anesthesia Evaluation  Patient identified by MRN, date of birth, ID band Patient awake    Reviewed: Allergy & Precautions, H&P , NPO status , Patient's Chart, lab work & pertinent test results  Airway Mallampati: II TM Distance: >3 FB Neck ROM: Full    Dental no notable dental hx. (+) Edentulous Upper and Edentulous Lower   Pulmonary neg pulmonary ROS,  breath sounds clear to auscultation  Pulmonary exam normal       Cardiovascular negative cardio ROS  Rhythm:Regular Rate:Normal     Neuro/Psych CVA, Residual Symptoms negative neurological ROS  negative psych ROS   GI/Hepatic negative GI ROS, Neg liver ROS, GERD-  Medicated and Controlled,  Endo/Other  negative endocrine ROSHypothyroidism   Renal/GU negative Renal ROS  negative genitourinary   Musculoskeletal negative musculoskeletal ROS (+)   Abdominal   Peds negative pediatric ROS (+)  Hematology negative hematology ROS (+)   Anesthesia Other Findings   Reproductive/Obstetrics negative OB ROS                           Anesthesia Physical Anesthesia Plan  ASA: II  Anesthesia Plan: General   Post-op Pain Management:    Induction: Intravenous  Airway Management Planned: LMA  Additional Equipment:   Intra-op Plan:   Post-operative Plan: Extubation in OR  Informed Consent: I have reviewed the patients History and Physical, chart, labs and discussed the procedure including the risks, benefits and alternatives for the proposed anesthesia with the patient or authorized representative who has indicated his/her understanding and acceptance.   Dental advisory given  Plan Discussed with: CRNA  Anesthesia Plan Comments:         Anesthesia Quick Evaluation

## 2012-07-01 NOTE — Anesthesia Postprocedure Evaluation (Signed)
Anesthesia Post Note  Patient: Maria Henry  Procedure(s) Performed: Procedure(s) (LRB): RIGHT KNEE ARTHROSCOPY WITH PARTIAL MEDIAL/LATERAL MENISECTOMY (Right)  Anesthesia type: General  Patient location: PACU  Post pain: Pain level controlled  Post assessment: Post-op Vital signs reviewed  Last Vitals: BP 141/64  Pulse 77  Temp(Src) 37.1 C  Resp 14  Ht 5\' 1"  (1.549 m)  Wt 177 lb 2 oz (80.343 kg)  BMI 33.48 kg/m2  SpO2 99%  Post vital signs: Reviewed  Level of consciousness: sedated  Complications: No apparent anesthesia complications

## 2012-07-01 NOTE — Transfer of Care (Signed)
Immediate Anesthesia Transfer of Care Note  Patient: Maria Henry  Procedure(s) Performed: Procedure(s) with comments: RIGHT KNEE ARTHROSCOPY WITH PARTIAL MEDIAL/LATERAL MENISECTOMY (Right) - RIGHT KNEE ARTHROSCOPY WITH PARTIAL MEDIAL/LATERAL MENISECTOMY  Patient Location: PACU  Anesthesia Type:General  Level of Consciousness: awake and alert   Airway & Oxygen Therapy: Patient Spontanous Breathing and Patient connected to face mask oxygen  Post-op Assessment: Report given to PACU RN and Post -op Vital signs reviewed and stable  Post vital signs: Reviewed and stable  Complications: No apparent anesthesia complications

## 2012-07-01 NOTE — Anesthesia Postprocedure Evaluation (Signed)
Anesthesia Post Note  Patient: Maria Henry  Procedure(s) Performed: Procedure(s) (LRB): RIGHT KNEE ARTHROSCOPY WITH PARTIAL MEDIAL/LATERAL MENISECTOMY (Right)  Anesthesia type: General  Patient location: PACU  Post pain: Pain level controlled  Post assessment: Post-op Vital signs reviewed  Last Vitals: BP 129/56  Pulse 80  Temp(Src) 37.2 C (Oral)  Resp 16  Ht 5\' 1"  (1.549 m)  Wt 177 lb 2 oz (80.343 kg)  BMI 33.48 kg/m2  SpO2 94%  Post vital signs: Reviewed  Level of consciousness: sedated  Complications: No apparent anesthesia complications

## 2012-07-01 NOTE — Brief Op Note (Signed)
07/01/2012  4:11 PM  PATIENT:  Maria Henry  72 y.o. female  PRE-OPERATIVE DIAGNOSIS:  RIGHT KNEE TORN MEDIAL/LATERAL MENISCUS  POST-OPERATIVE DIAGNOSIS:  right knee torn medial/ lateral meniscus  PROCEDURE:  Procedure(s) with comments: RIGHT KNEE ARTHROSCOPY WITH PARTIAL MEDIAL/LATERAL MENISECTOMY (Right) - RIGHT KNEE ARTHROSCOPY WITH PARTIAL MEDIAL/LATERAL MENISECTOMY  SURGEON:  Surgeon(s) and Role:    * Drucilla Schmidt, MD - Primary  PHYSICIAN ASSISTANT:   ASSISTANTS:nurse  ANESTHESIA:   local and general  EBL:  Total I/O In: 1000 [I.V.:1000] Out: -   BLOOD ADMINISTERED:none  DRAINS: none   LOCAL MEDICATIONS USED:  MARCAINE     SPECIMEN:  No Specimen  DISPOSITION OF SPECIMEN:  N/A  COUNTS:  YES  TOURNIQUET:   Total Tourniquet Time Documented: Thigh (Right) - 58 minutes Total: Thigh (Right) - 58 minutes   DICTATION: .Other Dictation: Dictation Number [id number]  PLAN OF CARE: Discharge to home after PACU  PATIENT DISPOSITION:  PACU - hemodynamically stable.   Delay start of Pharmacological VTE agent (>24hrs) due to surgical blood loss or risk of bleeding: yes

## 2012-07-02 ENCOUNTER — Encounter (HOSPITAL_COMMUNITY): Payer: Self-pay | Admitting: Orthopedic Surgery

## 2012-07-02 NOTE — Op Note (Signed)
NAME:  Maria Henry, Maria Henry               ACCOUNT NO.:  [id number]  MEDICAL RECORD NO.:  [id number]  LOCATION:  [facility]                         FACILITY:  [facility]  PHYSICIAN:  Marlowe Kays, M.D.  DATE OF BIRTH:  21-Mar-1941  DATE OF PROCEDURE:  07/01/2012 DATE OF DISCHARGE:  07/01/2012                              OPERATIVE REPORT   PREOPERATIVE DIAGNOSES: 1. Torn medial and lateral menisci. 2. Osteoarthritis, right knee.  POSTOPERATIVE DIAGNOSES: 1. Torn medial and lateral menisci. 2. Osteoarthritis, right knee.  OPERATION: 1. Right knee arthroscopy with partial medial and lateral     meniscectomies. 2. Shaving of medial femoral condyle.  SURGEON:  Marlowe Kays, M.D.  ASSISTANT:  Nurse.  ANESTHESIA:  General.  PATHOLOGY AND JUSTIFICATION FOR PROCEDURE:  Painful right knee with an MRI demonstrating the preoperative diagnoses.  This was confirmed at Surgery.  PROCEDURE:  Satisfactory general anesthesia, Ace wrap, and knee support to left lower extremity, pneumatic tourniquet to the right lower extremity with the leg Esmarched out non-sterilely.  Tourniquet was inflated to 325 mmHg.  Thigh stabilizer was applied.  Right leg was then prepped with DuraPrep from stabilizer to ankle and draped in sterile field.  Time-out was performed.  Superior medial saline inflow.  First, through an anteromedial portal, the medial compartment knee joint was evaluated anteriorly.  She had good bit of disruption of the medial meniscus as well as some partial exfoliation of the articular cartilage of medial femoral condyle.  With a 3.5 shaver, I smoothed down the anterior medial meniscus with partial synovectomy.  I then shaved down the remainder of the medial meniscus and medial femoral condyle, with pre and post films being taken and was also small loose fragment, which I removed with pituitary rongeur.  I then reversed portals.  Her ACL was intact.  She had a similar picture of disruption of the  anterior third of the lateral meniscus, which I debrided out with the 3.5 shaver.  She also had some wear along most of the inner border of the lateral meniscus, but her lateral femoral condyle looked intact.  After shaving, the lateral meniscus looked up in suprapatellar area, the patella looked normal.  Knee joint was irrigated to clear and all fluid were possibly removed.  I closed the two entry portals with 4-0 nylon and injected 20 mL of 0.5% Marcaine with adrenaline through the inflow apparatus, and I closed this portal with 4-0 nylon as well.  Betadine, Adaptic, and dry sterile dressing were applied.  Tourniquet was released.  She tolerated the procedure well and was taken to the recovery room in satisfactory condition with no known complications.          ______________________________ Marlowe Kays, M.D.     JA/MEDQ  D:  07/01/2012  T:  07/02/2012  Job:  [id number]

## 2012-08-19 ENCOUNTER — Other Ambulatory Visit: Payer: Self-pay | Admitting: Orthopedic Surgery

## 2012-09-02 ENCOUNTER — Encounter (HOSPITAL_COMMUNITY): Payer: Self-pay | Admitting: Pharmacy Technician

## 2012-09-08 ENCOUNTER — Encounter (HOSPITAL_COMMUNITY): Payer: Self-pay

## 2012-09-08 ENCOUNTER — Encounter (HOSPITAL_COMMUNITY)
Admission: RE | Admit: 2012-09-08 | Discharge: 2012-09-08 | Disposition: A | Discharge status: Home / Self Care | Payer: Medicare PPO | Source: Ambulatory Visit | Attending: Orthopedic Surgery | Admitting: Orthopedic Surgery

## 2012-09-08 HISTORY — DX: Other amnesia: R41.3

## 2012-09-08 LAB — BASIC METABOLIC PANEL
BUN: 16 mg/dL (ref 6–23)
Chloride: 106 mEq/L (ref 96–112)
GFR calc Af Amer: 60 mL/min — ABNORMAL LOW (ref >90)
Potassium: 4.1 mEq/L (ref 3.5–5.1)

## 2012-09-08 LAB — CBC
HCT: 35.8 % — ABNORMAL LOW (ref 36.0–46.0)
RDW: 13.8 % (ref 11.5–15.5)
WBC: 6.8 10*3/uL (ref 4.0–10.5)

## 2012-09-08 LAB — APTT: aPTT: 34 seconds (ref 24–37)

## 2012-09-08 LAB — PROTIME-INR: Prothrombin Time: 12 seconds (ref 11.6–15.2)

## 2012-09-08 LAB — SURGICAL PCR SCREEN: MRSA, PCR: NEGATIVE

## 2012-09-08 NOTE — Progress Notes (Signed)
EKG 06/23/12 on EPIC, surgery clearance note 08/14/12 Dr. Ala Dach on chart

## 2012-09-08 NOTE — Patient Instructions (Addendum)
20 ERSEL WADLEIGH  09/08/2012   Your procedure is scheduled on: 09/16/12  Report to [facility] at 0930 AM.  Call this number if you have problems the morning of surgery 336-: [phone]   Remember:   Do not eat food or drink liquids After Midnight.     Take these medicines the morning of surgery with A SIP OF WATER: synthroid, prilosec, paxil   Do not wear jewelry, make-up or nail polish.  Do not wear lotions, powders, or perfumes. You may wear deodorant.  Do not shave 48 hours prior to surgery. Men may shave face and neck.  Do not bring valuables to the hospital.  Contacts, dentures or bridgework may not be worn into surgery.  Leave suitcase in the car. After surgery it may be brought to your room.  For patients admitted to the hospital, checkout time is 11:00 AM the day of discharge.    Please read over the following fact sheets that you were given: MRSA Information, blood fact sheet  Birdie Sons, RN  pre op nurse call if needed [phone]    FAILURE TO FOLLOW THESE INSTRUCTIONS MAY RESULT IN CANCELLATION OF YOUR SURGERY   Patient Signature: ___________________________________________

## 2012-09-09 NOTE — H&P (Signed)
NAME:  Maria Henry, Maria Henry               ACCOUNT NO.:  [id number]  MEDICAL RECORD NO.:  [id number]  LOCATION:                                 FACILITY:  PHYSICIAN:  Marlowe Kays, M.D.  DATE OF BIRTH:  05-08-1941  DATE OF ADMISSION: DATE OF DISCHARGE:                             HISTORY & PHYSICAL   CHIEF COMPLAINT:  Pain in my left knee.  PRESENT ILLNESS:  This is a 72 year old lady has been seen by [place] continued progressive problems concerning her knees.  She underwent right knee arthroscopy and has done very well with that knee but she is having more problems in her left knee.  This was felt to be some internal derangement of the knee.  However, x-rays indicated that she has bone on bone deterioration of the knee with end-stage osteoarthritis, marked degenerative changes in all 3 compartments and there is a shift of the femur, tibia on the medial aspect.  We decided after much discussion, including the risks and benefits of surgery, to do total knee replacement and arthroplasty of the left knee.  The patient is very active.  She is able to get about herself and was felt that if we did not do something about this knee, then she will lapse into a sedentary life.  PAST MEDICAL HISTORY:  The patient has had multiple surgeries with again arthroscopy of the right knee.  She has had done some sort of nasal surgery, shoulder rotator cuff repair, hysterectomy, hammertoe repair.  PRESENT FAMILY HISTORY:  Positive for cancer in the father.  Heart disease in her mother and diabetes in her sister.  SOCIAL HISTORY:  The patient is single.  She is a retired Runner, broadcasting/film/video.  Has no intake of alcohol or tobacco products.  She has a friend to help her after surgery with home health.  MEDICAL ALLERGIES:  None.  MEDICATIONS:  The patient is on tramadol, oxybutynin chloride, fluocinonide, ketoconazole shampoo, lorazepam, raloxifene, and omeprazole.  She is also on Plavix and stop 5 days prior to  surgery.  REVIEW OF SYSTEMS:  CNS:  No seizures, paralysis, numbness, or double vision.  The patient does have a history of stroke in 2002 and 1997. She also has intermittent migraines and she has noticed impaired memory. CARDIOVASCULAR:  No chest pain.  No angina or orthopnea. RESPIRATORY:  No productive cough.  No hemoptysis, shortness of breath. GASTROINTESTINAL:  No nausea, vomiting, or bloody stool. GENITOURINARY:  No dysuria, hematuria.  PHYSICAL EXAMINATION:  GENERAL:  Alert and cooperative friendly.  A 72- year-old, white female, who is fully alert. VITAL SIGNS:  On record. HEENT:  Normocephalic.  PERRLA.  EOM intact.  Oropharynx is clear. CHEST:  Clear to auscultation.  No rhonchi.  No rales. HEART:  Regular rate and rhythm.  No murmurs are heard. ABDOMEN:  Soft, nontender.  Liver and spleen not felt. GENITALIA, RECTAL, PELVIC, BREAST:  Not pertinent to present illness not done. KNEES:  She has very good range of motion in the right knee.  However, left knee with crepitus particularly patellofemoral and medial compartments.  She has some quad atrophy on the left as well, with some mild valgus.  ADMITTING DIAGNOSES: 1.  End-stage osteoarthritis of the left knee. 2. History of stroke. 3. Hypertension.     Maria Henry.   ______________________________ Marlowe Kays, M.D.    DLU/MEDQ  D:  09/08/2012  T:  09/09/2012  Job:  [id number]

## 2012-09-16 ENCOUNTER — Encounter (HOSPITAL_COMMUNITY)
Admission: RE | Disposition: A | Discharge status: Skilled Nursing Facility | Payer: Self-pay | Source: Ambulatory Visit | Attending: Orthopedic Surgery

## 2012-09-16 ENCOUNTER — Inpatient Hospital Stay (HOSPITAL_COMMUNITY): Payer: Medicare PPO

## 2012-09-16 ENCOUNTER — Inpatient Hospital Stay (HOSPITAL_COMMUNITY)
Admission: RE | Admit: 2012-09-16 | Discharge: 2012-09-19 | DRG: 470 | Disposition: A | Discharge status: Skilled Nursing Facility | Payer: Medicare PPO | Source: Ambulatory Visit | Attending: Orthopedic Surgery | Admitting: Orthopedic Surgery

## 2012-09-16 ENCOUNTER — Encounter (HOSPITAL_COMMUNITY): Payer: Self-pay | Admitting: *Deleted

## 2012-09-16 ENCOUNTER — Inpatient Hospital Stay (HOSPITAL_COMMUNITY): Payer: Medicare PPO | Admitting: Anesthesiology

## 2012-09-16 ENCOUNTER — Encounter (HOSPITAL_COMMUNITY): Payer: Self-pay | Admitting: Anesthesiology

## 2012-09-16 DIAGNOSIS — Z7902 Long term (current) use of antithrombotics/antiplatelets: Secondary | ICD-10-CM

## 2012-09-16 DIAGNOSIS — I10 Essential (primary) hypertension: Secondary | ICD-10-CM | POA: Diagnosis present

## 2012-09-16 DIAGNOSIS — Z96652 Presence of left artificial knee joint: Secondary | ICD-10-CM

## 2012-09-16 DIAGNOSIS — Z8673 Personal history of transient ischemic attack (TIA), and cerebral infarction without residual deficits: Secondary | ICD-10-CM

## 2012-09-16 DIAGNOSIS — M1710 Unilateral primary osteoarthritis, unspecified knee: Principal | ICD-10-CM | POA: Diagnosis present

## 2012-09-16 DIAGNOSIS — Z01812 Encounter for preprocedural laboratory examination: Secondary | ICD-10-CM

## 2012-09-16 DIAGNOSIS — K219 Gastro-esophageal reflux disease without esophagitis: Secondary | ICD-10-CM | POA: Diagnosis present

## 2012-09-16 HISTORY — PX: TOTAL KNEE ARTHROPLASTY: SHX125

## 2012-09-16 LAB — TYPE AND SCREEN: Antibody Screen: NEGATIVE

## 2012-09-16 SURGERY — ARTHROPLASTY, KNEE, TOTAL
Anesthesia: General | Site: Knee | Laterality: Left | Wound class: Clean

## 2012-09-16 MED ORDER — OXYCODONE HCL 5 MG/5ML PO SOLN: 5.0000 mg | Freq: Once | ORAL | Status: DC | PRN

## 2012-09-16 MED ORDER — PROPOFOL 10 MG/ML IV BOLUS
INTRAVENOUS | Status: DC | PRN
  Administered 2012-09-16: 150 mg via INTRAVENOUS

## 2012-09-16 MED ORDER — BUPIVACAINE-EPINEPHRINE 0.25% -1:200000 IJ SOLN
INTRAMUSCULAR | Status: DC | PRN
  Administered 2012-09-16: 20 mL

## 2012-09-16 MED ORDER — METHOCARBAMOL 500 MG PO TABS
500.0000 mg | ORAL_TABLET | Freq: Four times a day (QID) | ORAL | Status: DC | PRN
  Administered 2012-09-17 – 2012-09-18 (×3): 500 mg via ORAL
  Filled 2012-09-16 (×3): qty 1

## 2012-09-16 MED ORDER — LEVOTHYROXINE SODIUM 100 MCG PO TABS
100.0000 ug | ORAL_TABLET | Freq: Every day | ORAL | Status: DC
  Administered 2012-09-17 – 2012-09-19 (×3): 100 ug via ORAL
  Filled 2012-09-16 (×5): qty 1

## 2012-09-16 MED ORDER — SODIUM CHLORIDE 0.9 % IR SOLN
Status: DC | PRN
  Administered 2012-09-16: 1000 mL

## 2012-09-16 MED ORDER — METOCLOPRAMIDE HCL 10 MG PO TABS: 5.0000 mg | ORAL_TABLET | Freq: Three times a day (TID) | ORAL | Status: DC | PRN

## 2012-09-16 MED ORDER — NALOXONE HCL 0.4 MG/ML IJ SOLN: 0.4000 mg | INTRAMUSCULAR | Status: DC | PRN

## 2012-09-16 MED ORDER — PHENOL 1.4 % MT LIQD: 1.0000 | OROMUCOSAL | Status: DC | PRN

## 2012-09-16 MED ORDER — FENTANYL CITRATE 0.05 MG/ML IJ SOLN
INTRAMUSCULAR | Status: DC | PRN
  Administered 2012-09-16: 50 ug via INTRAVENOUS
  Administered 2012-09-16: 100 ug via INTRAVENOUS
  Administered 2012-09-16 (×2): 25 ug via INTRAVENOUS
  Administered 2012-09-16: 50 ug via INTRAVENOUS

## 2012-09-16 MED ORDER — OMEPRAZOLE MAGNESIUM 20 MG PO TBEC: 20.0000 mg | DELAYED_RELEASE_TABLET | Freq: Every day | ORAL | Status: DC

## 2012-09-16 MED ORDER — ACETAMINOPHEN 10 MG/ML IV SOLN: 1000.0000 mg | Freq: Once | INTRAVENOUS | Status: DC | PRN

## 2012-09-16 MED ORDER — SODIUM CHLORIDE 0.9 % IJ SOLN: 9.0000 mL | INTRAMUSCULAR | Status: DC | PRN

## 2012-09-16 MED ORDER — ONDANSETRON HCL 4 MG/2ML IJ SOLN: 4.0000 mg | Freq: Four times a day (QID) | INTRAMUSCULAR | Status: DC | PRN

## 2012-09-16 MED ORDER — OXYCODONE HCL 5 MG PO TABS
5.0000 mg | ORAL_TABLET | ORAL | Status: DC | PRN
  Administered 2012-09-17 (×2): 5 mg via ORAL
  Filled 2012-09-16: qty 2
  Filled 2012-09-16: qty 1
  Filled 2012-09-16: qty 2

## 2012-09-16 MED ORDER — SODIUM CHLORIDE 0.9 % IV SOLN
INTRAVENOUS | Status: DC
  Administered 2012-09-16: 20:00:00 via INTRAVENOUS

## 2012-09-16 MED ORDER — ACETAMINOPHEN 650 MG RE SUPP: 650.0000 mg | Freq: Four times a day (QID) | RECTAL | Status: DC | PRN

## 2012-09-16 MED ORDER — RIVAROXABAN 10 MG PO TABS
10.0000 mg | ORAL_TABLET | Freq: Every day | ORAL | Status: DC
  Administered 2012-09-17 – 2012-09-19 (×3): 10 mg via ORAL
  Filled 2012-09-16 (×5): qty 1

## 2012-09-16 MED ORDER — CEFAZOLIN SODIUM-DEXTROSE 2-3 GM-% IV SOLR
2.0000 g | Freq: Three times a day (TID) | INTRAVENOUS | Status: AC
  Administered 2012-09-16 – 2012-09-17 (×2): 2 g via INTRAVENOUS
  Filled 2012-09-16 (×2): qty 50

## 2012-09-16 MED ORDER — MENTHOL 3 MG MT LOZG: 1.0000 | LOZENGE | OROMUCOSAL | Status: DC | PRN

## 2012-09-16 MED ORDER — CEFAZOLIN SODIUM-DEXTROSE 2-3 GM-% IV SOLR
2.0000 g | INTRAVENOUS | Status: AC
  Administered 2012-09-16: 2 g via INTRAVENOUS

## 2012-09-16 MED ORDER — PANTOPRAZOLE SODIUM 40 MG PO TBEC
40.0000 mg | DELAYED_RELEASE_TABLET | Freq: Every day | ORAL | Status: DC
  Administered 2012-09-17 – 2012-09-19 (×3): 40 mg via ORAL
  Filled 2012-09-16 (×3): qty 1

## 2012-09-16 MED ORDER — HYDROMORPHONE 0.3 MG/ML IV SOLN
INTRAVENOUS | Status: DC
  Administered 2012-09-16: 0.999 mg via INTRAVENOUS
  Administered 2012-09-16: 17:00:00 via INTRAVENOUS
  Administered 2012-09-17 (×3): 0.599 mg via INTRAVENOUS

## 2012-09-16 MED ORDER — LACTATED RINGERS IV SOLN
INTRAVENOUS | Status: DC | PRN
  Administered 2012-09-16 (×2): via INTRAVENOUS

## 2012-09-16 MED ORDER — ACETAMINOPHEN 10 MG/ML IV SOLN
INTRAVENOUS | Status: DC | PRN
  Administered 2012-09-16: 1000 mg via INTRAVENOUS

## 2012-09-16 MED ORDER — ONDANSETRON HCL 4 MG PO TABS: 4.0000 mg | ORAL_TABLET | Freq: Four times a day (QID) | ORAL | Status: DC | PRN

## 2012-09-16 MED ORDER — MEPERIDINE HCL 50 MG/ML IJ SOLN: 6.2500 mg | INTRAMUSCULAR | Status: DC | PRN

## 2012-09-16 MED ORDER — DEXTROSE 5 % IV SOLN
500.0000 mg | Freq: Four times a day (QID) | INTRAVENOUS | Status: DC | PRN
  Administered 2012-09-16: 500 mg via INTRAVENOUS
  Filled 2012-09-16: qty 5

## 2012-09-16 MED ORDER — DIPHENHYDRAMINE HCL 50 MG/ML IJ SOLN: 12.5000 mg | Freq: Four times a day (QID) | INTRAMUSCULAR | Status: DC | PRN

## 2012-09-16 MED ORDER — DIPHENHYDRAMINE HCL 12.5 MG/5ML PO ELIX: 12.5000 mg | ORAL_SOLUTION | Freq: Four times a day (QID) | ORAL | Status: DC | PRN

## 2012-09-16 MED ORDER — TRAMADOL HCL 50 MG PO TABS
50.0000 mg | ORAL_TABLET | Freq: Four times a day (QID) | ORAL | Status: DC | PRN
  Administered 2012-09-17 – 2012-09-19 (×5): 50 mg via ORAL
  Filled 2012-09-16 (×5): qty 1

## 2012-09-16 MED ORDER — HYDROMORPHONE HCL PF 1 MG/ML IJ SOLN: 0.5000 mg | INTRAMUSCULAR | Status: DC | PRN

## 2012-09-16 MED ORDER — HYDROMORPHONE HCL PF 1 MG/ML IJ SOLN: 0.2500 mg | INTRAMUSCULAR | Status: DC | PRN

## 2012-09-16 MED ORDER — OXYBUTYNIN CHLORIDE ER 5 MG PO TB24
5.0000 mg | ORAL_TABLET | Freq: Every day | ORAL | Status: DC
  Administered 2012-09-16 – 2012-09-19 (×4): 5 mg via ORAL
  Filled 2012-09-16 (×4): qty 1

## 2012-09-16 MED ORDER — PAROXETINE HCL 30 MG PO TABS
30.0000 mg | ORAL_TABLET | Freq: Every morning | ORAL | Status: DC
  Administered 2012-09-17 – 2012-09-19 (×3): 30 mg via ORAL
  Filled 2012-09-16 (×3): qty 1

## 2012-09-16 MED ORDER — LORAZEPAM 1 MG PO TABS
2.0000 mg | ORAL_TABLET | Freq: Every evening | ORAL | Status: DC | PRN
  Administered 2012-09-17: 2 mg via ORAL
  Filled 2012-09-16: qty 2

## 2012-09-16 MED ORDER — 0.9 % SODIUM CHLORIDE (POUR BTL) OPTIME
TOPICAL | Status: DC | PRN
  Administered 2012-09-16: 1000 mL

## 2012-09-16 MED ORDER — PROMETHAZINE HCL 25 MG/ML IJ SOLN: 6.2500 mg | INTRAMUSCULAR | Status: DC | PRN

## 2012-09-16 MED ORDER — ACETAMINOPHEN 325 MG PO TABS
650.0000 mg | ORAL_TABLET | Freq: Four times a day (QID) | ORAL | Status: DC | PRN
  Administered 2012-09-17: 650 mg via ORAL
  Filled 2012-09-16: qty 2

## 2012-09-16 MED ORDER — OXYCODONE HCL 5 MG PO TABS: 5.0000 mg | ORAL_TABLET | Freq: Once | ORAL | Status: DC | PRN

## 2012-09-16 MED ORDER — METOCLOPRAMIDE HCL 5 MG/ML IJ SOLN: 5.0000 mg | Freq: Three times a day (TID) | INTRAMUSCULAR | Status: DC | PRN

## 2012-09-16 MED ORDER — POVIDONE-IODINE 7.5 % EX SOLN: Freq: Once | CUTANEOUS | Status: DC

## 2012-09-16 SURGICAL SUPPLY — 55 items
BAG ZIPLOCK 12X15 (MISCELLANEOUS) ×4 IMPLANT
BANDAGE ELASTIC 4 VELCRO ST LF (GAUZE/BANDAGES/DRESSINGS) ×2 IMPLANT
BANDAGE ELASTIC 6 VELCRO ST LF (GAUZE/BANDAGES/DRESSINGS) ×2 IMPLANT
BANDAGE ESMARK 6X9 LF (GAUZE/BANDAGES/DRESSINGS) ×1 IMPLANT
BANDAGE GAUZE ELAST BULKY 4 IN (GAUZE/BANDAGES/DRESSINGS) ×2 IMPLANT
BLADE SAG 18X100X1.27 (BLADE) ×2 IMPLANT
BNDG ESMARK 6X9 LF (GAUZE/BANDAGES/DRESSINGS) ×2
CEMENT BONE 1-PACK (Cement) ×4 IMPLANT
CLOTH BEACON ORANGE TIMEOUT ST (SAFETY) ×2 IMPLANT
CONT SPECI 4OZ STER CLIK (MISCELLANEOUS) ×2 IMPLANT
CUFF TOURN SGL QUICK 34 (TOURNIQUET CUFF) ×1
CUFF TRNQT CYL 34X4X40X1 (TOURNIQUET CUFF) ×1 IMPLANT
DRAPE EXTREMITY T 121X128X90 (DRAPE) ×2 IMPLANT
DRAPE LG THREE QUARTER DISP (DRAPES) ×2 IMPLANT
DRAPE POUCH INSTRU U-SHP 10X18 (DRAPES) ×2 IMPLANT
DRAPE U-SHAPE 47X51 STRL (DRAPES) ×2 IMPLANT
DRSG ADAPTIC 3X8 NADH LF (GAUZE/BANDAGES/DRESSINGS) ×2 IMPLANT
DRSG PAD ABDOMINAL 8X10 ST (GAUZE/BANDAGES/DRESSINGS) ×4 IMPLANT
ELECT BLADE TIP CTD 4 INCH (ELECTRODE) ×2 IMPLANT
ELECT REM PT RETURN 9FT ADLT (ELECTROSURGICAL) ×2
ELECTRODE REM PT RTRN 9FT ADLT (ELECTROSURGICAL) ×1 IMPLANT
EVACUATOR 1/8 PVC DRAIN (DRAIN) ×2 IMPLANT
FACESHIELD LNG OPTICON STERILE (SAFETY) ×10 IMPLANT
GLOVE ECLIPSE 8.0 STRL XLNG CF (GLOVE) ×4 IMPLANT
GLOVE INDICATOR 8.0 STRL GRN (GLOVE) ×6 IMPLANT
GOWN STRL REIN XL XLG (GOWN DISPOSABLE) ×4 IMPLANT
HANDPIECE INTERPULSE COAX TIP (DISPOSABLE) ×1
IMMOBILIZER KNEE 20 (SOFTGOODS) ×4
IMMOBILIZER KNEE 20 THIGH 36 (SOFTGOODS) ×2 IMPLANT
KIT BASIN OR (CUSTOM PROCEDURE TRAY) ×2 IMPLANT
MANIFOLD NEPTUNE II (INSTRUMENTS) ×2 IMPLANT
NEEDLE HYPO 22GX1.5 SAFETY (NEEDLE) ×2 IMPLANT
NS IRRIG 1000ML POUR BTL (IV SOLUTION) ×2 IMPLANT
PACK TOTAL JOINT (CUSTOM PROCEDURE TRAY) ×2 IMPLANT
PADDING CAST COTTON 6X4 STRL (CAST SUPPLIES) ×4 IMPLANT
POSITIONER SURGICAL ARM (MISCELLANEOUS) ×2 IMPLANT
SET HNDPC FAN SPRY TIP SCT (DISPOSABLE) ×1 IMPLANT
SPONGE GAUZE 4X4 12PLY (GAUZE/BANDAGES/DRESSINGS) ×2 IMPLANT
SPONGE LAP 18X18 X RAY DECT (DISPOSABLE) IMPLANT
SPONGE SURGIFOAM ABS GEL 100 (HEMOSTASIS) IMPLANT
STAPLER VISISTAT 35W (STAPLE) ×2 IMPLANT
STEM REGULAR FLUTED (Stem) ×2 IMPLANT
SUCTION FRAZIER 12FR DISP (SUCTIONS) ×2 IMPLANT
SUT BONE WAX W31G (SUTURE) ×2 IMPLANT
SUT VIC AB 1 CT1 27 (SUTURE) ×6
SUT VIC AB 1 CT1 27XBRD ANTBC (SUTURE) ×6 IMPLANT
SUT VIC AB 2-0 CT1 27 (SUTURE) ×2
SUT VIC AB 2-0 CT1 27XBRD (SUTURE) ×2 IMPLANT
SYR 20CC LL (SYRINGE) ×2 IMPLANT
TOWEL OR 17X26 10 PK STRL BLUE (TOWEL DISPOSABLE) ×4 IMPLANT
TOWER CARTRIDGE SMART MIX (DISPOSABLE) ×2 IMPLANT
TRAY FOLEY CATH 14FRSI W/METER (CATHETERS) ×2 IMPLANT
TUBING CONNECTING 10 (TUBING) ×2 IMPLANT
WATER STERILE IRR 1500ML POUR (IV SOLUTION) ×4 IMPLANT
WRAP KNEE MAXI GEL POST OP (GAUZE/BANDAGES/DRESSINGS) ×2 IMPLANT

## 2012-09-16 NOTE — Consult Note (Signed)
I agree with Mr Underwood's H and P.  She is ok for surgery.

## 2012-09-16 NOTE — Anesthesia Postprocedure Evaluation (Signed)
Anesthesia Post Note  Patient: Maria Henry  Procedure(s) Performed: Procedure(s) (LRB): LEFT TOTAL KNEE ARTHROPLASTY (Left)  Anesthesia type: General  Patient location: PACU  Post pain: Pain level controlled  Post assessment: Post-op Vital signs reviewed  Last Vitals: BP 123/73  Pulse 76  Temp(Src) 36.9 C (Oral)  Resp 12  Ht 5' 1.5" (1.562 m)  Wt 176 lb (79.833 kg)  BMI 32.72 kg/m2  SpO2 99%  Post vital signs: Reviewed  Level of consciousness: sedated  Complications: No apparent anesthesia complications

## 2012-09-16 NOTE — Anesthesia Preprocedure Evaluation (Addendum)
Anesthesia Evaluation  Patient identified by MRN, date of birth, ID band Patient awake    Reviewed: Allergy & Precautions, H&P , NPO status , Patient's Chart, lab work & pertinent test results  Airway Mallampati: II TM Distance: >3 FB Neck ROM: Full    Dental no notable dental hx. (+) Edentulous Upper, Edentulous Lower and Dental Advisory Given   Pulmonary neg pulmonary ROS,  breath sounds clear to auscultation  Pulmonary exam normal       Cardiovascular negative cardio ROS  Rhythm:Regular Rate:Normal     Neuro/Psych  Headaches, PSYCHIATRIC DISORDERS Depression CVA, Residual Symptoms    GI/Hepatic negative GI ROS, Neg liver ROS, GERD-  Medicated and Controlled,  Endo/Other  negative endocrine ROSHypothyroidism   Renal/GU negative Renal ROS     Musculoskeletal negative musculoskeletal ROS (+)   Abdominal   Peds  Hematology negative hematology ROS (+)   Anesthesia Other Findings   Reproductive/Obstetrics negative OB ROS                           Anesthesia Physical  Anesthesia Plan  ASA: II  Anesthesia Plan: General   Post-op Pain Management:    Induction: Intravenous  Airway Management Planned: LMA  Additional Equipment:   Intra-op Plan:   Post-operative Plan: Extubation in OR  Informed Consent: I have reviewed the patients History and Physical, chart, labs and discussed the procedure including the risks, benefits and alternatives for the proposed anesthesia with the patient or authorized representative who has indicated his/her understanding and acceptance.   Dental advisory given  Plan Discussed with: CRNA  Anesthesia Plan Comments:         Anesthesia Quick Evaluation

## 2012-09-16 NOTE — Transfer of Care (Signed)
Immediate Anesthesia Transfer of Care Note  Patient: Maria Henry  Procedure(s) Performed: Procedure(s): LEFT TOTAL KNEE ARTHROPLASTY (Left)  Patient Location: PACU  Anesthesia Type:General  Level of Consciousness: awake, sedated and patient cooperative  Airway & Oxygen Therapy: Patient Spontanous Breathing and Patient connected to face mask oxygen  Post-op Assessment: Report given to PACU RN and Post -op Vital signs reviewed and stable  Post vital signs: Reviewed and stable  Complications: No apparent anesthesia complications

## 2012-09-17 ENCOUNTER — Encounter (HOSPITAL_COMMUNITY): Payer: Self-pay | Admitting: Orthopedic Surgery

## 2012-09-17 LAB — CBC
MCH: 30.7 pg (ref 26.0–34.0)
MCHC: 32.4 g/dL (ref 30.0–36.0)
MCV: 94.6 fL (ref 78.0–100.0)
Platelets: 237 10*3/uL (ref 150–400)
RDW: 14 % (ref 11.5–15.5)

## 2012-09-17 NOTE — Op Note (Signed)
NAME:  Maria Henry, Maria Henry               ACCOUNT NO.:  [id number]  MEDICAL RECORD NO.:  [id number]  LOCATION:  1610                         FACILITY:  [facility]  PHYSICIAN:  Marlowe Kays, M.D.  DATE OF BIRTH:  [birth year]  DATE OF PROCEDURE:  09/16/2012 DATE OF DISCHARGE:                              OPERATIVE REPORT   PREOPERATIVE DIAGNOSIS:  End-stage osteoarthritis, left knee.  POSTOPERATIVE DIAGNOSIS:  End-stage osteoarthritis, left knee.  OPERATION:  Total knee replacement, left.  SURGEON:  Marlowe Kays, M.D.  ASSISTANTDruscilla Brownie. Cherlynn June.  ANESTHESIA:  General.  PATHOLOGY AND JUSTIFICATION FOR PROCEDURE:  She has advanced tricompartmental arthritis with pain, slight flexion contracture, and functional disability because of the pain.  PROCEDURE:  Satisfactory general anesthesia, prophylactic antibiotics, Foley catheter inserted.  Left hip stabilizer, pneumatic tourniquet, surefoot.  Time-out was performed.  The left leg was prepped with DuraPrep from tourniquet to ankle and draped in sterile field.  Ioban employed.  Vertical midline incision down to the patellar mechanism with median parapatellar incision to open the joint.  I undermined the medial collateral ligament and the pace anserinus from the proximal tibia.  The knee joint was opened and the patella flexed, and knee everted. Remnants of the menisci were excised.  She had no ACL remaining. Osteophytes removed from the femur and the patella.  She had basically no articular cartilage left and used the femoral condyles.  I made a 5/16-inch drill hole in the distal femur followed by the canal finder and the axis liner for a 5-degree distal femur cut.  I elected to take a 12 mm off the distal femur because she had a slight flexion contracture. I then had a leveling cut on the proximal tibia in order to have room to place the sizing cradle and we sized her femur at #5.  Scribe lines were placed on the distal femur.   I then used a distal femoral cutting jig to make anterior and posterior cuts and posterior and anterior chamfering. Following this, I returned to the tibia, and placed the sizing jig which was either 3 or 5.  I made my initial intramedullary drill hole based on the side on his template.  I then placed a canal finder followed by the aligning rod to make a 90-degree cut of the proximal tibia.  I utilized I elected to take 4 mm off the depressed medial tibial plateau.  The cut was made and I then placed the jig for creating the patellar groove and the notchplasty.  After making this cuts, I then placed lamina spreader and removed remnants of bone and ligamentum flavum from behind both femoral condyles.  I then went through a trial reduction and found that a size 5 tibial base plate was appropriate as well as the 10-mm spacer gave a nice fit with 0 extension and good stability to the knee.  Based on this, I used extramedullary rod, splitting the bimalleolar distance and marked the anterior margins for the tibial spacer using the base plate for use later.  While the knee was in extension, I then measured the patella at a 26 and used the 10 mm recess cutting jig to make  my initial 10 mm cut with a trial 26 patella in place and intramedullary soft tissue, extra bone around the femoral nerve.  We then returned to the tibia and stabilized the base plate with 3 fixation screw pins using the previous scribe lines on the anterior tibia.  We then used the tripod apparatus to ream for the tibial component stem and felt this with hand reaming for an intramedullary extension to the tibial component which I elected to use because of her deformity.  With a hand reamer, reamed up to a 13 x 80 mm, and then used a boss reamer to enlarge the proximal portion of the tibial plateau.  I then went through a trial reduction with a size 5 stabilized which seemed to be the best fit at this point, as well as the 13 x  80 mm extension, she has nice reduction.  We then went ahead and opened the components and water picked the knee, I injected the soft tissues with 0.5% Marcaine plain. Meanwhile, the methacrylate was being mixed and we then went ahead and glued in the individual components.  The tibia which was glued in base plate, leaving the extension noncemented.  We impacted this and removed excess methacrylate from around it.  I then went ahead and glued in the femur in the same fashion and with the knee extension with a 10-mm spacer, glued in the patella holding the patellar clamp.  Excess methacrylate was removed from around all the components.  When methacrylate hardened, I removed the trial patella spacer, and we irrigated the wound, and checked for small amounts of methacrylate.  A 5 x 10 mm thickness spacer worked well.  We went ahead and put the final posterior stabilized, followed by 10-mm component impacting them and checking motion.  It was nice and stable.  I also checked the patellar alignment.  There was no instability lateralward, so she did not need lateral release.  The wound was then once again irrigated well and Hemovac was placed and wound closed with interrupted #1 Vicryl in the quadriceps tendon and deep subcutaneous tissue and distally in the retinaculum and deep subcutaneous tissue.  Superficial subcutaneous tissue was closed with 2-0 Vicryl, staples in the skin.  Betadine, Adaptic, dry sterile dressing were applied.  Tourniquet was released roughly a number of tourniquet time.  She tolerated the procedure well, was taken to the recovery room in satisfactory condition with no known complications and essentially no blood loss.  Mr. Angie Fava assistance was required because of the complexity of the case.          ______________________________ Marlowe Kays, M.D.     JA/MEDQ  D:  09/16/2012  T:  09/17/2012  Job:  [id number]

## 2012-09-17 NOTE — Progress Notes (Signed)
Utilization review completed.  

## 2012-09-17 NOTE — Progress Notes (Deleted)
Received order for bed.

## 2012-09-17 NOTE — Care Management Note (Signed)
  Page 1 of 1   09/17/2012     6:20:03 PM   CARE MANAGEMENT NOTE 09/17/2012  Patient:  Maria Henry, Maria Henry   Account Number:  [id number]  Date Initiated:  09/17/2012  Documentation initiated by:  Colleen Can  Subjective/Objective Assessment:   dx osteoarthritis left knee; total knee replacemnt     Action/Plan:   Cm spoke with patient. Pt states she wants to go home where housemate is caregiver. PT states pt requiring moderate asistance. Caregiver is currently not in room. Pt not able to answer some questions. CM will f/u  with caregiver&pt progress.   Anticipated DC Date:  09/19/2012   Anticipated DC Plan:  HOME W HOME HEALTH SERVICES  In-house referral  Clinical Social Worker      DC Planning Services  CM consult      Choice offered to / List presented to:             Status of service:  In process, will continue to follow Medicare Important Message given?   (If response is "NO", the following Medicare IM given date fields will be blank) Date Medicare IM given:   Date Additional Medicare IM given:    Discharge Disposition:    Per UR Regulation:    If discussed at Long Length of Stay Meetings, dates discussed:    Comments:

## 2012-09-17 NOTE — Evaluation (Signed)
Physical Therapy Evaluation Patient Details Name: Maria Henry MRN: [id number] DOB: 1941/02/11 Today's Date: 09/17/2012 Time: 1030-1053 PT Time Calculation (min): 23 min  PT Assessment / Plan / Recommendation Clinical Impression  72 yo female s/p L TKA. On eval, pt required Mod assist for mobility-able to ambulate ~15 feet with RW. Pt appears confused at times. Demonstrates general weakness,decreased activity tolerance, decreased safety awareness. May need to consider ST rehab placement as SNF. Unsure if "friend/roommate" wil be able to safely manage with pt. Pt will require 24 hour supervision/assist if she discharges home.     PT Assessment  Patient needs continued PT services    Follow Up Recommendations  Home health PT;Supervision/Assistance - 24 hour (may need to consider SNF placement)    Does the patient have the potential to tolerate intense rehabilitation      Barriers to Discharge        Equipment Recommendations  Rolling walker with 5" wheels    Recommendations for Other Services OT consult   Frequency 7X/week    Precautions / Restrictions Precautions Precautions: Fall Required Braces or Orthoses: Knee Immobilizer - Left Knee Immobilizer - Left: Discontinue once straight leg raise with < 10 degree lag Restrictions Weight Bearing Restrictions: No LLE Weight Bearing: Weight bearing as tolerated   Pertinent Vitals/Pain 8/10 L knee      Mobility  Bed Mobility Bed Mobility: Supine to Sit Supine to Sit: 3: Mod assist;HOB elevated;With rails Details for Bed Mobility Assistance: assist for L LE off bed. Increased time. VCs safety, technique, hand placement.  Transfers Transfers: Sit to Stand;Stand to Sit Sit to Stand: 3: Mod assist;From bed;From elevated surface Stand to Sit: 3: Mod assist;To chair/3-in-1;With upper extremity assist Details for Transfer Assistance: VCs safety, technique, hand placement. Assist to rise, stabilize, control  descent Ambulation/Gait Ambulation/Gait Assistance: 3: Mod assist Ambulation Distance (Feet): 15 Feet Assistive device: Rolling walker Ambulation/Gait Assistance Details: VCS safety, technique, sequence, posture. assist to stabilize throughout ambulation and to maneuver with RW. Followed with recliner. Pt fatigues easily.  Gait Pattern: Step-to pattern;Decreased stride length;Antalgic;Trunk flexed;Decreased step length - left    Exercises     PT Diagnosis: Altered mental status;Acute pain;Abnormality of gait;Difficulty walking  PT Problem List: Decreased strength;Decreased mobility;Decreased range of motion;Decreased activity tolerance;Decreased balance;Pain;Decreased safety awareness;Decreased knowledge of use of DME;Decreased knowledge of precautions;Decreased cognition PT Treatment Interventions: DME instruction;Gait training;Stair training;Functional mobility training;Therapeutic activities;Therapeutic exercise;Patient/family education   PT Goals Acute Rehab PT Goals PT Goal Formulation: With patient Time For Goal Achievement: 09/24/12 Potential to Achieve Goals: Good Pt will go Supine/Side to Sit: with supervision PT Goal: Supine/Side to Sit - Progress: Goal set today Pt will go Sit to Supine/Side: with supervision PT Goal: Sit to Supine/Side - Progress: Goal set today Pt will go Sit to Stand: with supervision PT Goal: Sit to Stand - Progress: Goal set today Pt will Ambulate: 51 - 150 feet;with supervision;with rolling walker PT Goal: Ambulate - Progress: Goal set today Pt will Go Up / Down Stairs: Flight;with min assist;with rail(s);with least restrictive assistive device PT Goal: Up/Down Stairs - Progress: Goal set today  Visit Information  Last PT Received On: 09/17/12 Assistance Needed: +1    Subjective Data  Subjective: My roommate can help Patient Stated Goal: home   Prior Functioning  Home Living Lives With: Friend(s) Type of Home: House Home Access: Level  entry Home Layout: Two level Alternate Level Stairs-Number of Steps: 10 Alternate Level Stairs-Rails: Right Home Adaptive Equipment: Walker - four wheeled;Walker -  rolling;Wheelchair - manual;Bedside commode/3-in-1 Prior Function Level of Independence: Independent Able to Take Stairs?: Yes Communication Communication: No difficulties    Cognition  Cognition Arousal/Alertness: Awake/alert Behavior During Therapy: WFL for tasks assessed/performed Overall Cognitive Status: Difficult to assess (pt appears confused at times)    Extremity/Trunk Assessment Right Lower Extremity Assessment RLE ROM/Strength/Tone: [facility] for tasks assessed Left Lower Extremity Assessment LLE ROM/Strength/Tone: Deficits LLE ROM/Strength/Tone Deficits: hip flex 2/5, hip abd/add 2/5   Balance    End of Session PT - End of Session Equipment Utilized During Treatment: Gait belt Activity Tolerance: Patient limited by fatigue;Patient limited by pain Patient left: in chair;with call bell/phone within reach CPM Left Knee CPM Left Knee: Off  GP     Rebeca Alert, MPT Pager: [phone]

## 2012-09-17 NOTE — Progress Notes (Signed)
Subjective: 1 Day Post-Op Procedure(s) (LRB): LEFT TOTAL KNEE ARTHROPLASTY (Left) Patient reports pain as 7 on 0-10 scale.    Objective: Vital signs in last 24 hours: Temp:  [97.6 F (36.4 C)-99.1 F (37.3 C)] 98.5 F (36.9 C) (05/07 0457) Pulse Rate:  [70-92] 76 (05/07 0457) Resp:  [11-18] 18 (05/07 0726) BP: (113-160)/(58-80) 117/69 mmHg (05/07 0457) SpO2:  [93 %-100 %] 100 % (05/07 0726) Weight:  [79.833 kg (176 lb)] 79.833 kg (176 lb) (05/06 1655)  Intake/Output from previous day: 05/06 0701 - 05/07 0700 In: 2170 [P.O.:420; I.V.:1700; IV Piggyback:50] Out: 1125 [Urine:855; Drains:270] Intake/Output this shift:     Recent Labs  09/17/12 0510  HGB 10.3*    Recent Labs  09/17/12 0510  WBC 9.2  RBC 3.36*  HCT 31.8*  PLT 237   No results found for this basename: NA, K, CL, CO2, BUN, CREATININE, GLUCOSE, CALCIUM,  in the last 72 hours No results found for this basename: LABPT, INR,  in the last 72 hours  Neurologically intact Sensation intact distally Intact pulses distally Dorsiflexion/Plantar flexion intact,  Awake,alert, oriented.  Hemovac dc'd.  Hgb 10.3.  Assessment/Plan: 1 Day Post-Op Procedure(s) (LRB): LEFT TOTAL KNEE ARTHROPLASTY (Left) Up with therapy  Maria Henry III,Maria Henry L 09/17/2012, 8:29 AM

## 2012-09-17 NOTE — Progress Notes (Signed)
Physical Therapy Treatment Patient Details Name: Maria Henry MRN: [id number] DOB: March 03, 1941 Today's Date: 09/17/2012 Time: 1450-1520 PT Time Calculation (min): 30 min  PT Assessment / Plan / Recommendation Comments on Treatment Session  2nd tx session. Pt progressing slowly-only able to ambulate ~25 feet today. Discussed possible need for ST Rehab-at this time do not think pt will be able to climb 10 steps up to bedroom. Notifed RN of possible need for SNF as well. Pt may need to consider living on 1st level of home.     Follow Up Recommendations  Home health PT;Supervision/Assistance - 24 hour vs SNF (depending on progress and if pt will agree to SNF)     Does the patient have the potential to tolerate intense rehabilitation     Barriers to Discharge        Equipment Recommendations  Rolling walker with 5" wheels    Recommendations for Other Services OT consult  Frequency 7X/week   Plan Discharge plan needs to be updated    Precautions / Restrictions Precautions Precautions: Fall;Knee Required Braces or Orthoses: Knee Immobilizer - Left Knee Immobilizer - Left: Discontinue once straight leg raise with < 10 degree lag Restrictions Weight Bearing Restrictions: No LLE Weight Bearing: Weight bearing as tolerated   Pertinent Vitals/Pain L knee-unrated    Mobility  Bed Mobility Bed Mobility: Supine to Sit;Sit to Supine Supine to Sit: 4: Min assist;HOB elevated;With rails Sit to Supine: 4: Min assist;HOB flat;With rail Details for Bed Mobility Assistance: assist for L LE off/onto bed. Increased time. VCs safety, technique, hand placement.  Transfers Transfers: Sit to Stand;Stand to Sit Sit to Stand: 3: Mod assist;From bed;From elevated surface Stand to Sit: 4: Min assist;To bed Details for Transfer Assistance: VCs safety, technique, hand placement. Assist to rise, stabilize, control descent Ambulation/Gait Ambulation/Gait Assistance: 3: Mod assist Ambulation Distance  (Feet): 25 Feet Assistive device: Rolling walker Ambulation/Gait Assistance Details: VCs safety, technique, sequence, posture, step length. Assist to stabilize pt and maneuver with RW. Slow gait speed.  Gait Pattern: Step-to pattern;Decreased stride length;Antalgic;Trunk flexed;Decreased step length - left    Exercises Total Joint Exercises Ankle Circles/Pumps: AROM;Both;10 reps;Supine Quad Sets: AROM;5 reps;Supine Heel Slides: AAROM;Left;10 reps;Supine Hip ABduction/ADduction: AAROM;Left;10 reps;Supine Straight Leg Raises: AAROM;Left;10 reps;Supine   PT Diagnosis:    PT Problem List:   PT Treatment Interventions:     PT Goals Acute Rehab PT Goals Pt will go Supine/Side to Sit: with supervision PT Goal: Supine/Side to Sit - Progress: Progressing toward goal Pt will go Sit to Supine/Side: with supervision PT Goal: Sit to Supine/Side - Progress: Progressing toward goal Pt will go Sit to Stand: with supervision PT Goal: Sit to Stand - Progress: Progressing toward goal Pt will Ambulate: 51 - 150 feet;with supervision;with rolling walker PT Goal: Ambulate - Progress: Progressing toward goal  Visit Information  Last PT Received On: 09/17/12 Assistance Needed: +1    Subjective Data  Subjective: Im sorry I couldn't do it Patient Stated Goal: home   Cognition  Cognition Arousal/Alertness: Awake/alert Behavior During Therapy: WFL for tasks assessed/performed Overall Cognitive Status:  (pt still appears confused)    Balance     End of Session PT - End of Session Equipment Utilized During Treatment: Gait belt Activity Tolerance: Patient limited by fatigue Patient left: in bed;with call bell/phone within reach;with bed alarm set   GP     Rebeca Alert, MPT Pager: [phone]

## 2012-09-18 LAB — CBC
HCT: 28.3 % — ABNORMAL LOW (ref 36.0–46.0)
Hemoglobin: 9.4 g/dL — ABNORMAL LOW (ref 12.0–15.0)
MCHC: 33.2 g/dL (ref 30.0–36.0)
RBC: 3.04 MIL/uL — ABNORMAL LOW (ref 3.87–5.11)

## 2012-09-18 NOTE — Progress Notes (Signed)
Clinical Social Work Department BRIEF PSYCHOSOCIAL ASSESSMENT 09/18/2012  Patient:  Maria Henry, Maria Henry     Account Number:  [id number]     Admit date:  09/16/2012  Clinical Social Worker:  Candie Chroman  Date/Time:  09/18/2012 04:25 PM  Referred by:  Physician  Date Referred:  09/18/2012 Referred for  SNF Placement   Other Referral:   Interview type:  Patient Other interview type:    PSYCHOSOCIAL DATA Living Status:  FRIEND(S) Admitted from facility:   Level of care:   Primary support name:  Pat Hest Primary support relationship to patient:  FRIEND Degree of support available:   supportive    CURRENT CONCERNS Current Concerns  Post-Acute Placement   Other Concerns:    SOCIAL WORK ASSESSMENT / PLAN Pt is a 72 yr old female living at home prior to hospitalization. CSW met with pt / friend to assist with d/c planning. PT has recommended ST Rehab following hospital d/c. Pt is in agreement with d/c plan. SNF search has been initiated and bed offers will be provided as received. CSW will assist with [facility] authorization process.   Assessment/plan status:  Psychosocial Support/Ongoing Assessment of Needs Other assessment/ plan:   Information/referral to community resources:   [employer] provided and insurance coverage reviewed.    PATIENT'S/FAMILY'S RESPONSE TO PLAN OF CARE: Pt would prefer to go home but has agreed to try ST Rehab.   Cori Razor LCSW [phone]

## 2012-09-18 NOTE — Progress Notes (Signed)
Patient ID: Maria Henry, female   DOB: 03-08-1941, 72 y.o.   MRN: [id number] POD 2--alert.  Hgb 9.4.

## 2012-09-18 NOTE — Evaluation (Signed)
Occupational Therapy Evaluation Patient Details Name: Maria Henry MRN: [id number] DOB: [birth year] Today's Date: 09/18/2012 Time: 4098-1191 OT Time Calculation (min): 33 min  OT Assessment / Plan / Recommendation Clinical Impression  Pt is s/p L TKA and displays decreased strength and overall independence with ADL. She will benefit from skilled OT services to improve independence with self care tasks.     OT Assessment  Patient needs continued OT Services    Follow Up Recommendations  Supervision/Assistance - 24 hour;SNF versus Home health OT . Depends on if caregiver can provide assist required at discharge. Otherwise will need SNF. Pt currently mod assist to stand.   Barriers to Discharge      Equipment Recommendations  None recommended by OT    Recommendations for Other Services    Frequency  Min 2X/week    Precautions / Restrictions Precautions Precautions: Fall;Knee Required Braces or Orthoses: Knee Immobilizer - Left Knee Immobilizer - Left: Discontinue once straight leg raise with < 10 degree lag Restrictions Weight Bearing Restrictions: No LLE Weight Bearing: Weight bearing as tolerated        ADL  Eating/Feeding: Independent;Simulated Where Assessed - Eating/Feeding: Chair Grooming: Simulated;Wash/dry hands;Set up Where Assessed - Grooming: Supported sitting Upper Body Bathing: Simulated;Chest;Right arm;Left arm;Abdomen;Set up;Supervision/safety Where Assessed - Upper Body Bathing: Unsupported sitting Lower Body Bathing: Simulated;Moderate assistance Where Assessed - Lower Body Bathing: Supported sit to stand Upper Body Dressing: Simulated;Set up;Supervision/safety Where Assessed - Upper Body Dressing: Unsupported sitting Lower Body Dressing: Simulated;Maximal assistance Where Assessed - Lower Body Dressing: Supported sit to stand Toilet Transfer: Performed;Moderate assistance Toilet Transfer Method: Other (comment) (with walker into bathroom) Toilet  Transfer Equipment: Comfort height toilet;Grab bars Toileting - Clothing Manipulation and Hygiene: Simulated;Moderate assistance Where Assessed - Toileting Clothing Manipulation and Hygiene: Sit to stand from 3-in-1 or toilet Equipment Used: Rolling walker ADL Comments: Pt needs frequent verbal cues and assist to step into walker and not let it get too far ahead of her. She requires mod assist to stand from EOB and toilet. Once up, she does well with functional mobility with walker but needs the cues for walker safety. Not sure how much assist her housemate can provide and no one present to confirm help at d/c    OT Diagnosis: Generalized weakness  OT Problem List: Decreased strength;Decreased knowledge of use of DME or AE OT Treatment Interventions: Self-care/ADL training;Therapeutic activities;DME and/or AE instruction;Patient/family education   OT Goals Acute Rehab OT Goals OT Goal Formulation: With patient Time For Goal Achievement: 09/25/12 Potential to Achieve Goals: Good ADL Goals Pt Will Perform Grooming: with supervision;Standing at sink ADL Goal: Grooming - Progress: Goal set today Pt Will Perform Lower Body Bathing: with min assist;Sit to stand from chair;Sit to stand from bed ADL Goal: Lower Body Bathing - Progress: Goal set today Pt Will Perform Lower Body Dressing: with min assist;Sit to stand from chair;Sit to stand from bed ADL Goal: Lower Body Dressing - Progress: Goal set today Pt Will Transfer to Toilet: Ambulation;3-in-1;with min assist;with DME (min guard) ADL Goal: Toilet Transfer - Progress: Goal set today Pt Will Perform Toileting - Clothing Manipulation: with supervision;Standing ADL Goal: Toileting - Clothing Manipulation - Progress: Goal set today  Visit Information  Last OT Received On: 09/18/12 Assistance Needed: +1 PT/OT Co-Evaluation/Treatment: Yes    Subjective Data  Subjective: I dont hurt laying here Patient Stated Goal: agreeable to get up with  PT/OT. None stated on her own   Prior Functioning     Home  Living Lives With: Friend(s) Type of Home: House Home Access: Level entry Home Layout: Two level Alternate Level Stairs-Number of Steps: 10 Alternate Level Stairs-Rails: Right Bathroom Shower/Tub: Health visitor: Standard Home Adaptive Equipment: Environmental consultant - four wheeled;Walker - rolling;Wheelchair - manual;Bedside commode/3-in-1 Prior Function Level of Independence: Independent;Needs assistance Needs Assistance: Meal Prep Meal Prep: Total Able to Take Stairs?: Yes Communication Communication: No difficulties         Vision/Perception     Cognition  Cognition Arousal/Alertness: Awake/alert Behavior During Therapy: WFL for tasks assessed/performed Overall Cognitive Status:  (pt alittle slow with responding to questions at times)    Extremity/Trunk Assessment Right Upper Extremity Assessment RUE ROM/Strength/Tone: [facility] for tasks assessed Left Upper Extremity Assessment LUE ROM/Strength/Tone: WFL for tasks assessed     Mobility Bed Mobility Bed Mobility: Supine to Sit Supine to Sit: 4: Min assist;HOB elevated;With rails Transfers Transfers: Sit to Stand;Stand to Sit Sit to Stand: 3: Mod assist;With upper extremity assist;From bed;From toilet Stand to Sit: 4: Min assist;With upper extremity assist;To toilet;To chair/3-in-1 Details for Transfer Assistance: verbal cues for hand placment and safety. mod assist to rise and stabilize.      Exercise     Balance Balance Balance Assessed: Yes Dynamic Standing Balance Dynamic Standing - Level of Assistance: 4: Min assist   End of Session OT - End of Session Equipment Utilized During Treatment: Gait belt Activity Tolerance: Patient tolerated treatment well Patient left: in chair;with call bell/phone within reach CPM Left Knee CPM Left Knee: Off  GO     Lennox Laity [phone] 09/18/2012, 11:42 AM

## 2012-09-18 NOTE — Progress Notes (Signed)
Physical Therapy Treatment Patient Details Name: Maria Henry MRN: [id number] DOB: 1940/09/09 Today's Date: 09/18/2012 Time: 1010-1038 PT Time Calculation (min): 28 min  PT Assessment / Plan / Recommendation Comments on Treatment Session  Improved mobility this session. Still feel pt could possibly benefit from ST rehab-if agreeable. Pt will need to have 24 hour supervision/assist for ADLs/mobility and caregiver will need to be able to provide at least moderate level assist at this time. Pt still needs to be able to ascend 10 steps to bedroom    Follow Up Recommendations  Home health PT;Supervision/Assistance - 24 hour;SNF (depending on progress and caregiver's ability )     Does the patient have the potential to tolerate intense rehabilitation     Barriers to Discharge        Equipment Recommendations  Rolling walker with 5" wheels    Recommendations for Other Services OT consult  Frequency 7X/week   Plan Discharge plan remains appropriate    Precautions / Restrictions Precautions Precautions: Knee;Fall Required Braces or Orthoses: Knee Immobilizer - Left Knee Immobilizer - Left: Discontinue once straight leg raise with < 10 degree lag Restrictions Weight Bearing Restrictions: No LLE Weight Bearing: Weight bearing as tolerated   Pertinent Vitals/Pain 5/10 L knee    Mobility  Bed Mobility Bed Mobility: Supine to Sit Supine to Sit: 4: Min assist;HOB elevated;With rails Details for Bed Mobility Assistance: assist for L LE off/onto bed. Increased time. VCs safety, technique, hand placement. some relaince on rail Transfers Transfers: Sit to Stand;Stand to Sit Sit to Stand: 3: Mod assist;From bed;From toilet Stand to Sit: 4: Min assist;To chair/3-in-1 Details for Transfer Assistance: verbal cues for hand placment and safety. mod assist to rise and stabilize.  Ambulation/Gait Ambulation/Gait Assistance: 4: Min assist Ambulation Distance (Feet): 65 Feet Assistive device:  Rolling walker Ambulation/Gait Assistance Details: Slow gait speed. Moderate cues for step length, distance from Rw, posture. Assist to stabilize pt and maneuver with RW Gait Pattern: Step-to pattern;Step-through pattern;Antalgic;Decreased stride length;Trunk flexed    Exercises     PT Diagnosis:    PT Problem List:   PT Treatment Interventions:     PT Goals Acute Rehab PT Goals Pt will go Supine/Side to Sit: with supervision PT Goal: Supine/Side to Sit - Progress: Progressing toward goal Pt will go Sit to Stand: with supervision PT Goal: Sit to Stand - Progress: Progressing toward goal Pt will Ambulate: 51 - 150 feet;with supervision;with rolling walker PT Goal: Ambulate - Progress: Progressing toward goal  Visit Information  Last PT Received On: 09/18/12 Assistance Needed: +1 PT/OT Co-Evaluation/Treatment: Yes    Subjective Data  Subjective: It doesn't hurt too bad right now Patient Stated Goal: home   Cognition  Cognition Arousal/Alertness: Awake/alert Behavior During Therapy: WFL for tasks assessed/performed Overall Cognitive Status:  (appears clearer today. still slow to process at times)    Balance  Balance Balance Assessed: Yes Dynamic Standing Balance Dynamic Standing - Level of Assistance: 4: Min assist  End of Session PT - End of Session Activity Tolerance: Patient tolerated treatment well Patient left: in chair;with call bell/phone within reach CPM Left Knee CPM Left Knee: Off   GP     Rebeca Alert, MPT Pager: [phone]

## 2012-09-18 NOTE — Progress Notes (Signed)
09/18/2012 Colleen Can BSN RN CCM [phone] CM spoke with pt's caregiver/housemate today regarding discharge plans. Advised caregiver that physical therapy states pt needs moderate assistance with mobility and that patient is progressing slowly.Recommendations are for 24hr assist/supervision with HHpt or SNF rehab. Housemate states she will discuss with patient regarding options. CM spoke with caregiver after her discussion with patient and current plans are for SNF rehab. Referral to CSW.

## 2012-09-18 NOTE — Progress Notes (Signed)
Physical Therapy Treatment Patient Details Name: Maria Henry MRN: [id number] DOB: [birth year] Today's Date: 09/18/2012 Time: 5621-3086 PT Time Calculation (min): 40 min  PT Assessment / Plan / Recommendation Comments on Treatment Session  Mobility slowly improving. Caregiver present during 2nd session. Explained to caregiver that pt is still requiring moderate assist for mobility and that stair negotiation may be an issue. Caregiver stated CSW will be by to discuss rehab with pt/caregiver. Pt not yet agreeable to rehab. Feel pt could benefit from ST rehab before going home if pt is agreeable. If not, then pt will need HHPT with 24 hour supervison/assist.     Follow Up Recommendations  SNF-if pt agreeable (Home health PT;Supervision/Assistance - 24 hour if SNF not an option)     Does the patient have the potential to tolerate intense rehabilitation     Barriers to Discharge        Equipment Recommendations  Rolling walker with 5" wheels    Recommendations for Other Services OT consult  Frequency 7X/week   Plan Discharge plan remains appropriate    Precautions / Restrictions Precautions Precautions: Knee;Fall Required Braces or Orthoses: Knee Immobilizer - Left Knee Immobilizer - Left: Discontinue once straight leg raise with < 10 degree lag Restrictions Weight Bearing Restrictions: No LLE Weight Bearing: Weight bearing as tolerated   Pertinent Vitals/Pain 4/10 L knee    Mobility  Bed Mobility Bed Mobility: Supine to Sit;Sit to Supine Supine to Sit: 4: Min assist Sit to Supine: 4: Min assist Details for Bed Mobility Assistance: assist for L LE off/onto bed. Increased time. VCs safety, technique, hand placement. some relaince on rail Transfers Transfers: Sit to Stand;Stand to Sit Sit to Stand: 3: Mod assist;From bed Stand to Sit: 4: Min assist;To bed Details for Transfer Assistance: verbal cues for hand placment and safety. mod assist to rise and stabilize.   Ambulation/Gait Ambulation/Gait Assistance: 4: Min assist Ambulation Distance (Feet): 75 Feet Assistive device: Rolling walker Ambulation/Gait Assistance Details: slow gait speed. Moderate cues for step length, distanc from RW, posture. Assist to stabilize pt and maneuver with RW Gait Pattern: Step-to pattern;Step-through pattern;Antalgic;Decreased stride length;Trunk flexed    Exercises Total Joint Exercises Ankle Circles/Pumps: AROM;Both;10 reps;Supine Quad Sets: AROM;Both;10 reps;Supine Heel Slides: AAROM;Left;10 reps;Supine Hip ABduction/ADduction: AAROM;Left;10 reps;Supine Straight Leg Raises: AAROM;Left;10 reps;Supine   PT Diagnosis:    PT Problem List:   PT Treatment Interventions:     PT Goals Acute Rehab PT Goals Pt will go Supine/Side to Sit: with supervision PT Goal: Supine/Side to Sit - Progress: Progressing toward goal Pt will go Sit to Supine/Side: with supervision PT Goal: Sit to Supine/Side - Progress: Progressing toward goal Pt will go Sit to Stand: with supervision PT Goal: Sit to Stand - Progress: Progressing toward goal Pt will Ambulate: 51 - 150 feet;with supervision;with rolling walker PT Goal: Ambulate - Progress: Progressing toward goal  Visit Information  Last PT Received On: 09/18/12 Assistance Needed: +1 PT/OT Co-Evaluation/Treatment: Yes    Subjective Data  Subjective: I wanna go home Patient Stated Goal: home   Cognition  Cognition Arousal/Alertness: Awake/alert Behavior During Therapy: WFL for tasks assessed/performed Overall Cognitive Status:  (appears clearer today. still slow to process at times)    Balance  Balance Balance Assessed: Yes Dynamic Standing Balance Dynamic Standing - Level of Assistance: 4: Min assist  End of Session PT - End of Session Activity Tolerance: Patient tolerated treatment well Patient left: in bed;with call bell/phone within reach;with family/visitor present   GP  Rebeca Alert, MPT Pager:  [phone]

## 2012-09-19 LAB — CBC
HCT: 23 % — ABNORMAL LOW (ref 36.0–46.0)
MCH: 31 pg (ref 26.0–34.0)
MCV: 92.7 fL (ref 78.0–100.0)
Platelets: 189 10*3/uL (ref 150–400)
RBC: 2.48 MIL/uL — ABNORMAL LOW (ref 3.87–5.11)

## 2012-09-19 LAB — BASIC METABOLIC PANEL
BUN: 16 mg/dL (ref 6–23)
CO2: 24 mEq/L (ref 19–32)
Calcium: 8.9 mg/dL (ref 8.4–10.5)
Creatinine, Ser: 1.24 mg/dL — ABNORMAL HIGH (ref 0.50–1.10)
Glucose, Bld: 128 mg/dL — ABNORMAL HIGH (ref 70–99)

## 2012-09-19 NOTE — Progress Notes (Signed)
FL2 in chart for MD signature.  CSW assisting with SNF placement. Bed offers will be provided this am. Once SNF is chosen CSW will assist with [facility] authorization. D/C Summary will be needed today for d/c either today / tomorrow.  Cori Razor LCSW [phone]

## 2012-09-19 NOTE — Progress Notes (Signed)
Occupational Therapy Treatment Patient Details Name: Maria Henry MRN: [id number] DOB: 05/09/1941 Today's Date: 09/19/2012 Time: 7829-5621 OT Time Calculation (min): 16 min  OT Assessment / Plan / Recommendation Comments on Treatment Session Pt now planning SNF. Tolerated session well. May possibly d/c today.     Follow Up Recommendations  SNF;Supervision/Assistance - 24 hour    Barriers to Discharge       Equipment Recommendations  None recommended by OT    Recommendations for Other Services    Frequency Min 2X/week   Plan Discharge plan needs to be updated    Precautions / Restrictions Precautions Precautions: Knee;Fall Required Braces or Orthoses: Knee Immobilizer - Left Knee Immobilizer - Left: Discontinue once straight leg raise with < 10 degree lag Restrictions Weight Bearing Restrictions: No LLE Weight Bearing: Weight bearing as tolerated        ADL  Toilet Transfer: Moderate assistance Toilet Transfer Method: Stand pivot Toileting - Clothing Manipulation and Hygiene: Minimal assistance Where Assessed - Toileting Clothing Manipulation and Hygiene: Standing ADL Comments: Pt continues to have the most difficulty with bed mobility and the sit to stand transitions . She needs verbal cues for hand placement. Pt planning SNF now.     OT Diagnosis:    OT Problem List:   OT Treatment Interventions:     OT Goals ADL Goals ADL Goal: Toilet Transfer - Progress: Progressing toward goals ADL Goal: Toileting - Clothing Manipulation - Progress: Progressing toward goals  Visit Information  Last OT Received On: 09/19/12 Assistance Needed: +1    Subjective Data  Subjective: I am doing alright Patient Stated Goal: go to rehab   Prior Functioning       Cognition  Cognition Arousal/Alertness: Awake/alert Behavior During Therapy: WFL for tasks assessed/performed Overall Cognitive Status: Within Functional Limits for tasks assessed    Mobility  Bed Mobility Bed  Mobility: Supine to Sit Supine to Sit: 4: Min assist;HOB elevated;With rails Sit to Supine: 4: Min assist Details for Bed Mobility Assistance: assist for L LE off/onto bed. Increased time. VCs safety, technique, hand placement. some relaince on rail Transfers Transfers: Sit to Stand;Stand to Sit Sit to Stand: 3: Mod assist;With upper extremity assist;From bed Stand to Sit: 4: Min assist;To chair/3-in-1;With upper extremity assist Details for Transfer Assistance: verbal cues for hand placement       Balance Balance Balance Assessed: Yes Dynamic Standing Balance Dynamic Standing - Level of Assistance: 4: Min assist   End of Session OT - End of Session Equipment Utilized During Treatment: Gait belt Activity Tolerance: Patient tolerated treatment well Patient left: in chair;with family/visitor present;with call bell/phone within reach  GO     Lennox Laity [phone] 09/19/2012, 12:07 PM

## 2012-09-19 NOTE — Progress Notes (Signed)
Physical Therapy Treatment Patient Details Name: Maria Henry MRN: [id number] DOB: [birth year] Today's Date: 09/19/2012 Time: 0920-1000 PT Time Calculation (min): 40 min  PT Assessment / Plan / Recommendation Comments on Treatment Session  Pt planning to d/[facility] to [facility] today. continuing to progress with mobility    Follow Up Recommendations  SNF     Does the patient have the potential to tolerate intense rehabilitation     Barriers to Discharge        Equipment Recommendations  Rolling walker with 5" wheels    Recommendations for Other Services OT consult  Frequency 7X/week   Plan Discharge plan remains appropriate    Precautions / Restrictions Precautions Precautions: Knee;Fall Required Braces or Orthoses: Knee Immobilizer - Left Knee Immobilizer - Left: Discontinue once straight leg raise with < 10 degree lag Restrictions Weight Bearing Restrictions: No LLE Weight Bearing: Weight bearing as tolerated   Pertinent Vitals/Pain 4/10 L knee with activity    Mobility  Bed Mobility Bed Mobility: Supine to Sit;Sit to Supine Supine to Sit: 4: Min assist Sit to Supine: 4: Min assist Details for Bed Mobility Assistance: assist for L LE off/onto bed. Increased time. VCs safety, technique, hand placement. some relaince on rail Transfers Transfers: Sit to Stand;Stand to Sit Sit to Stand: 3: Mod assist;From bed;From toilet Stand to Sit: 4: Min assist;To toilet;To bed Details for Transfer Assistance: verbal cues for hand placment and safety. mod assist to rise and stabilize.  Ambulation/Gait Ambulation/Gait Assistance: 4: Min assist Ambulation Distance (Feet): 100 Feet Assistive device: Rolling walker Ambulation/Gait Assistance Details: slow gait speed. assist to stabilize pt and maneuver with RW.  Gait Pattern: Step-to pattern;Antalgic;Trunk flexed;Decreased stride length;Decreased step length - right;Decreased step length - left    Exercises Total Joint Exercises Ankle  Circles/Pumps: AROM;Both;10 reps;Supine Quad Sets: AROM;Both;10 reps;Supine Short Arc Quad: AAROM;Left;10 reps;Supine Heel Slides: AAROM;Left;10 reps;Supine Hip ABduction/ADduction: AAROM;Left;10 reps;Supine Straight Leg Raises: AAROM;Left;10 reps;Supine Goniometric ROM: 10-34 degrees   PT Diagnosis:    PT Problem List:   PT Treatment Interventions:     PT Goals Acute Rehab PT Goals Pt will go Supine/Side to Sit: with supervision PT Goal: Supine/Side to Sit - Progress: Progressing toward goal Pt will go Sit to Supine/Side: with supervision PT Goal: Sit to Supine/Side - Progress: Progressing toward goal Pt will go Sit to Stand: with supervision PT Goal: Sit to Stand - Progress: Progressing toward goal Pt will Ambulate: 51 - 150 feet;with supervision;with rolling walker PT Goal: Ambulate - Progress: Progressing toward goal  Visit Information  Last PT Received On: 09/19/12 Assistance Needed: +1    Subjective Data  Subjective: I guess Im going to [employer] Patient Stated Goal: home   Cognition  Cognition Arousal/Alertness: Awake/alert Behavior During Therapy: [facility] for tasks assessed/performed Overall Cognitive Status: Within Functional Limits for tasks assessed    Balance     End of Session PT - End of Session Equipment Utilized During Treatment: Gait belt Activity Tolerance: Patient tolerated treatment well Patient left: in bed;with call bell/phone within reach CPM Left Knee CPM Left Knee: Off   GP     Rebeca Alert, MPT Pager: [phone]

## 2012-09-19 NOTE — Discharge Summary (Signed)
NAME:  REHMAT, MURTAGH               ACCOUNT NO.:  [id number]  MEDICAL RECORD NO.:  [id number]  LOCATION:  1610                         FACILITY:  [facility]  PHYSICIAN:  Marlowe Kays, M.D.  DATE OF BIRTH:  [birth year]  DATE OF ADMISSION:  09/16/2012 DATE OF DISCHARGE:  09/19/2012                              DISCHARGE SUMMARY   ADMITTING DIAGNOSIS:  End-stage osteoarthritis, left knee.  DISCHARGE DIAGNOSIS:  End-stage osteoarthritis, left knee.  OPERATION:  Osteonics total knee replacement, left on Sep 17, 2012.  HOSPITAL SUMMARY:  I followed this lady for an extended period of time because of progressive pain and deformity in her left knee to the point that she is no longer able to perform her activities of daily living. The surgery itself went uneventfully.  Postoperatively, she was somewhat obtunded with oxycodone.  This was discontinued and she is placed on tramadol instead, and was covered with Xarelto postoperatively.  At the time of discharge, she was doing much better in physical therapy, but it was felt by the personnel that she would be better in a skilled nursing facility until she was able to function well enough to go home, she will have a friend to help her.  Discharge medications of those listed on the discharge sheet.  She is to return in my office 2 weeks from surgery unless there is a problem and call [phone] for appointment.  CONDITION AT DISCHARGE:  Stable and improved.          ______________________________ Marlowe Kays, M.D.     JA/MEDQ  D:  09/19/2012  T:  09/19/2012  Job:  [id number]

## 2013-11-20 NOTE — Progress Notes (Signed)
Dr Darrelyn HillockGioffre-  NEED PRE OP ORDERS PLEASE

## 2013-11-24 ENCOUNTER — Other Ambulatory Visit: Payer: Self-pay | Admitting: Surgical

## 2013-11-25 ENCOUNTER — Encounter (HOSPITAL_COMMUNITY): Payer: Self-pay | Admitting: Pharmacy Technician

## 2013-11-26 NOTE — H&P (Signed)
TOTAL KNEE ADMISSION H&P  Patient is being admitted for right total knee arthroplasty.  Subjective:  Chief Complaint:right knee pain.  HPI: Maria Henry, 73 y.o. female, has a history of pain and functional disability in the right knee due to arthritis and has failed non-surgical conservative treatments for greater than 12 weeks to includeNSAID's and/or analgesics, corticosteriod injections, viscosupplementation injections and activity modification.  Onset of symptoms was gradual, starting >10 years ago with gradually worsening course since that time. The patient noted prior procedures on the knee to include  arthroscopy and menisectomy on the right knee(s).  Patient currently rates pain in the right knee(s) at 8 out of 10 with activity. Patient has night pain, worsening of pain with activity and weight bearing, pain that interferes with activities of daily living, pain with passive range of motion, crepitus and joint swelling.  Patient has evidence of periarticular osteophytes and joint space narrowing by imaging studies. There is no active infection.   Past Medical History  Diagnosis Date  . Depression   . Hypothyroidism   . GERD (gastroesophageal reflux disease)   . Headache(784.0)     hx of  . Arthritis   . 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 Impaired memory     Past Surgical History  Procedure Laterality Date  . Carpal tunnel release Bilateral   . Rotator cuff repair Bilateral     x2 left, x1 right  . Foot surgery Right   . Toe surgery Right 2011    "tried to straighten"  . Back surgery      lower back  . Knee arthroscopy Right 07/01/2012    Procedure: RIGHT KNEE ARTHROSCOPY WITH PARTIAL MEDIAL/LATERAL MENISECTOMY;  Surgeon: Drucilla Schmidt, MD;  Location: [facility];  Service: Orthopedics;  Laterality: Right;  RIGHT KNEE ARTHROSCOPY WITH PARTIAL MEDIAL/LATERAL MENISECTOMY  . Abdominal hysterectomy  1994  . Total knee arthroplasty Left 09/16/2012    Procedure: LEFT TOTAL  KNEE ARTHROPLASTY;  Surgeon: Drucilla Schmidt, MD;  Location: [facility];  Service: Orthopedics;  Laterality: Left;     Current outpatient prescriptions: acetaminophen (TYLENOL) 650 MG CR tablet, Take 650 mg by mouth every 8 (eight) hours as needed for pain., Disp: , Rfl: ;   Calcium Carbonate-Vitamin D (CALCIUM 600 + D PO), Take 1 tablet by mouth daily., Disp: , Rfl: ;   clopidogrel (PLAVIX) 75 MG tablet, Take 75 mg by mouth daily., Disp: , Rfl: ;   desonide (DESOWEN) 0.05 % cream, Apply 1 application topically 2 (two) times daily., Disp: , Rfl:  fluocinonide (LIDEX) 0.05 % external solution, Apply 1 application topically 2 (two) times daily as needed (itching). , Disp: , Rfl: ;   ketoconazole (NIZORAL) 2 % cream, Apply 1 application topically daily as needed for irritation., Disp: , Rfl: ;   ketoconazole (NIZORAL) 2 % shampoo, Apply topically 2 (two) times a week., Disp: , Rfl: ;   levothyroxine (SYNTHROID, LEVOTHROID) 88 MCG tablet, Take 88 mcg by mouth daily before breakfast., Disp: , Rfl:  LORazepam (ATIVAN) 1 MG tablet, Take 2 mg by mouth at bedtime as needed for anxiety (sleep). For sleep, Disp: , Rfl: ;   Multiple Vitamin (MULTIVITAMIN WITH MINERALS) TABS, Take 1 tablet by mouth at bedtime. , Disp: , Rfl: ;   naftifine (NAFTIN) 1 % cream, Apply 1 application topically daily as needed (for rash)., Disp: , Rfl: ;   omeprazole (PRILOSEC OTC) 20 MG tablet, Take 20 mg by mouth daily., Disp: ,  Rfl:  PARoxetine (PAXIL) 30 MG tablet, Take 30 mg by mouth every morning., Disp: , Rfl: ;   rosuvastatin (CRESTOR) 40 MG tablet, Take 40 mg by mouth daily., Disp: , Rfl: ;  traMADol (ULTRAM) 50 MG tablet, Take 50 mg by mouth every 6 (six) hours as needed for pain., Disp: , Rfl: ;   trospium (SANCTURA) 20 MG tablet, Take 20 mg by mouth 2 (two) times daily., Disp: , Rfl:   No Known Allergies  History  Substance Use Topics  . Smoking status: Never Smoker   . Smokeless tobacco: Never Used  . Alcohol Use:  No      Review of Systems  Constitutional: Negative.   HENT: Negative.   Eyes: Negative.   Respiratory: Negative.   Cardiovascular: Negative.   Gastrointestinal: Negative.   Genitourinary: Negative.   Musculoskeletal: Positive for joint pain. Negative for back pain, falls, myalgias and neck pain.       Right knee pain  Skin: Negative.   Neurological: Negative.   Endo/Heme/Allergies: Negative.   Psychiatric/Behavioral: Negative.     Objective:  Physical Exam  Constitutional: She is oriented to person, place, and time. She appears well-developed and well-nourished. No distress.  HENT:  Head: Normocephalic and atraumatic.  Right Ear: External ear normal.  Left Ear: External ear normal.  Nose: Nose normal.  Mouth/Throat: Oropharynx is clear and moist.  Eyes: Conjunctivae and EOM are normal.  Neck: Normal range of motion. Neck supple.  Cardiovascular: Normal rate, regular rhythm, normal heart sounds and intact distal pulses.   No murmur heard. Respiratory: Effort normal and breath sounds normal. No respiratory distress. She has no wheezes.  GI: Soft. Bowel sounds are normal. She exhibits no distension. There is no tenderness.  Musculoskeletal:       Right hip: Normal.       Left hip: Normal.       Right knee: She exhibits decreased range of motion and swelling. She exhibits no effusion and no erythema. Tenderness found. Medial joint line and lateral joint line tenderness noted.       Left knee: Normal.       Right lower leg: She exhibits no tenderness and no swelling.       Left lower leg: She exhibits no tenderness and no swelling.  Neurological: She is alert and oriented to person, place, and time. She has normal strength and normal reflexes. No sensory deficit.  Skin: No rash noted. She is not diaphoretic. No erythema.  Psychiatric: She has a normal mood and affect. Her behavior is normal.    Vitals Weight: 173 lb Height: 60.5in Body Surface Area: 1.83 m Body  Mass Index: 33.23 kg/m Pulse: 72 (Regular)  BP: 138/78 (Sitting, Left Arm, Standard)  Imaging Review Plain radiographs demonstrate severe degenerative joint disease of the right knee(s). The overall alignment ismild varus. The bone quality appears to be good for age and reported activity level.  Assessment/Plan:  End stage arthritis, right knee   The patient history, physical examination, clinical judgment of the provider and imaging studies are consistent with end stage degenerative joint disease of the right knee(s) and total knee arthroplasty is deemed medically necessary. The treatment options including medical management, injection therapy arthroscopy and arthroplasty were discussed at length. The risks and benefits of total knee arthroplasty were presented and reviewed. The risks due to aseptic loosening, infection, stiffness, patella tracking problems, thromboembolic complications and other imponderables were discussed. The patient acknowledged the explanation, agreed to proceed with the  plan and consent was signed. Patient is being admitted for inpatient treatment for surgery, pain control, PT, OT, prophylactic antibiotics, VTE prophylaxis, progressive ambulation and ADL's and discharge planning. The patient is planning to be discharged home with home health services     MelbetaAmber Azariyah Luhrs, [place]PA-C

## 2013-12-04 ENCOUNTER — Encounter (HOSPITAL_COMMUNITY)
Admission: RE | Admit: 2013-12-04 | Discharge: 2013-12-04 | Disposition: A | Discharge status: Home / Self Care | Payer: Medicare PPO | Source: Ambulatory Visit | Attending: Orthopedic Surgery | Admitting: Orthopedic Surgery

## 2013-12-04 ENCOUNTER — Other Ambulatory Visit: Payer: Self-pay

## 2013-12-04 ENCOUNTER — Ambulatory Visit (HOSPITAL_COMMUNITY)
Admission: RE | Admit: 2013-12-04 | Discharge: 2013-12-04 | Disposition: A | Discharge status: Home / Self Care | Payer: Medicare PPO | Source: Ambulatory Visit | Attending: Surgical | Admitting: Surgical

## 2013-12-04 ENCOUNTER — Encounter (HOSPITAL_COMMUNITY): Payer: Self-pay

## 2013-12-04 DIAGNOSIS — R918 Other nonspecific abnormal finding of lung field: Secondary | ICD-10-CM | POA: Insufficient documentation

## 2013-12-04 DIAGNOSIS — Z01812 Encounter for preprocedural laboratory examination: Secondary | ICD-10-CM | POA: Diagnosis not present

## 2013-12-04 DIAGNOSIS — Z01810 Encounter for preprocedural cardiovascular examination: Secondary | ICD-10-CM | POA: Diagnosis not present

## 2013-12-04 DIAGNOSIS — Z01818 Encounter for other preprocedural examination: Secondary | ICD-10-CM | POA: Insufficient documentation

## 2013-12-04 HISTORY — DX: Sleep apnea, unspecified: G47.30

## 2013-12-04 LAB — COMPREHENSIVE METABOLIC PANEL
ALT: 18 U/L (ref 0–35)
AST: 31 U/L (ref 0–37)
Albumin: 3.9 g/dL (ref 3.5–5.2)
Alkaline Phosphatase: 86 U/L (ref 39–117)
Anion gap: 11 (ref 5–15)
BUN: 14 mg/dL (ref 6–23)
CO2: 25 mEq/L (ref 19–32)
Calcium: 10 mg/dL (ref 8.4–10.5)
Chloride: 106 mEq/L (ref 96–112)
Creatinine, Ser: 1.25 mg/dL — ABNORMAL HIGH (ref 0.50–1.10)
GFR calc Af Amer: 49 mL/min — ABNORMAL LOW (ref >90)
GFR calc non Af Amer: 42 mL/min — ABNORMAL LOW (ref >90)
Glucose, Bld: 111 mg/dL — ABNORMAL HIGH (ref 70–99)
Potassium: 4.4 mEq/L (ref 3.7–5.3)
Sodium: 142 mEq/L (ref 137–147)
Total Bilirubin: 0.4 mg/dL (ref 0.3–1.2)
Total Protein: 7.5 g/dL (ref 6.0–8.3)

## 2013-12-04 LAB — CBC
HCT: 37.5 % (ref 36.0–46.0)
Hemoglobin: 12.4 g/dL (ref 12.0–15.0)
MCH: 30.5 pg (ref 26.0–34.0)
MCHC: 33.1 g/dL (ref 30.0–36.0)
MCV: 92.1 fL (ref 78.0–100.0)
PLATELETS: 286 10*3/uL (ref 150–400)
RBC: 4.07 MIL/uL (ref 3.87–5.11)
RDW: 13.3 % (ref 11.5–15.5)
WBC: 4.5 10*3/uL (ref 4.0–10.5)

## 2013-12-04 LAB — URINALYSIS, ROUTINE W REFLEX MICROSCOPIC
Bilirubin Urine: NEGATIVE
Glucose, UA: NEGATIVE mg/dL
Hgb urine dipstick: NEGATIVE
Ketones, ur: NEGATIVE mg/dL
Nitrite: NEGATIVE
Protein, ur: NEGATIVE mg/dL
Specific Gravity, Urine: 1.021 (ref 1.005–1.030)
Urobilinogen, UA: 0.2 mg/dL (ref 0.0–1.0)
pH: 6 (ref 5.0–8.0)

## 2013-12-04 LAB — SURGICAL PCR SCREEN
MRSA, PCR: NEGATIVE
STAPHYLOCOCCUS AUREUS: NEGATIVE

## 2013-12-04 LAB — URINE MICROSCOPIC-ADD ON

## 2013-12-04 LAB — PROTIME-INR
INR: 0.95 (ref 0.00–1.49)
Prothrombin Time: 12.7 seconds (ref 11.6–15.2)

## 2013-12-04 LAB — APTT: aPTT: 35 seconds (ref 24–37)

## 2013-12-04 NOTE — Pre-Procedure Instructions (Signed)
EKG AND CXR WERE DONE TODAY - PREOP AT [facility]. 

## 2013-12-04 NOTE — Pre-Procedure Instructions (Signed)
PT'S PREOP LABS FAXED TO DR. GIOFFRE'S OFFICE WITH NOTE THAT UA ABNORMAL.

## 2013-12-04 NOTE — Patient Instructions (Signed)
[facility]  ON:   Thursday  7/30  REPORT TO  SHORT STAY CENTER AT:  10:45 AM   [facility] AND FOLLOW SIGNS TO SHORT STAY CENTER.  DO NOT EAT OR DRINK ANYTHING AFTER MIDNIGHT THE NIGHT BEFORE YOUR SURGERY.  YOU MAY BRUSH YOUR TEETH, RINSE OUT YOUR MOUTH--BUT NO WATER, NO FOOD, NO CHEWING GUM, NO MINTS, NO CANDIES, NO CHEWING TOBACCO.  PLEASE TAKE THE FOLLOWING MEDICATIONS THE AM OF YOUR SURGERY WITH A FEW SIPS OF WATER:  SYNTHROID, OMEPRAZOLE, PAROXETINE, CRESTOR, TROSPIUM   DO NOT BRING VALUABLES, MONEY, CREDIT CARDS.  DO NOT WEAR JEWELRY, MAKE-UP, NAIL POLISH AND NO METAL PINS OR CLIPS IN YOUR HAIR. CONTACT LENS, DENTURES / PARTIALS, GLASSES SHOULD NOT BE WORN TO SURGERY AND IN MOST CASES-HEARING AIDS WILL NEED TO BE REMOVED.  BRING YOUR GLASSES CASE, ANY EQUIPMENT NEEDED FOR YOUR CONTACT LENS. FOR PATIENTS ADMITTED TO THE HOSPITAL--CHECK OUT TIME THE DAY OF DISCHARGE IS 11:00 AM.  ALL INPATIENT ROOMS ARE PRIVATE - WITH BATHROOM, TELEPHONE, TELEVISION AND WIFI INTERNET.                                                    PLEASE READ OVER ANY  FACT SHEETS THAT YOU WERE GIVEN: MRSA INFORMATION, BLOOD TRANSFUSION INFORMATION, INCENTIVE SPIROMETER INFORMATION.  PLEASE BE AWARE THAT YOU MAY NEED ADDITIONAL BLOOD DRAWN DAY OF YOUR SURGERY  _______________________________________________________________________   [facility] Pre[facility] Before surgery, you can play an important role.  Because skin is not sterile, your skin needs to be as free of germs as possible.  You can reduce the number of germs on your skin by washing with CHG (chlorahexidine gluconate) soap before surgery.  CHG is an antiseptic cleaner which kills germs and bonds with the skin to continue killing germs even after washing. Please DO NOT use if you have an allergy to CHG or antibacterial soaps.  If your skin becomes  reddened/irritated stop using the CHG and inform your nurse when you arrive at Short Stay. Do not shave (including legs and underarms) for at least 48 hours prior to the first CHG shower.  You may shave your face/neck. Please follow these instructions carefully:  1.  Shower with CHG Soap the night before surgery and the  morning of Surgery.  2.  If you choose to wash your hair, wash your hair first as usual with your  normal  shampoo.  3.  After you shampoo, rinse your hair and body thoroughly to remove the  shampoo.                           4.  Use CHG as you would any other liquid soap.  You can apply chg directly  to the skin and wash                       Gently with a scrungie or clean washcloth.  5.  Apply the CHG Soap to your body ONLY FROM THE NECK DOWN.   Do not use on face/ open  Wound or open sores. Avoid contact with eyes, ears mouth and genitals (private parts).                       Wash face,  Genitals (private parts) with your normal soap.             6.  Wash thoroughly, paying special attention to the area where your surgery  will be performed.  7.  Thoroughly rinse your body with warm water from the neck down.  8.  DO NOT shower/wash with your normal soap after using and rinsing off  the CHG Soap.                9.  Pat yourself dry with a clean towel.            10.  Wear clean pajamas.            11.  Place clean sheets on your bed the night of your first shower and do not  sleep with pets. Day of Surgery : Do not apply any lotions/deodorants the morning of surgery.  Please wear clean clothes to the hospital/surgery center.  FAILURE TO FOLLOW THESE INSTRUCTIONS MAY RESULT IN THE CANCELLATION OF YOUR SURGERY PATIENT SIGNATURE_________________________________  NURSE SIGNATURE__________________________________  ________________________________________________________________________   Maria Henry  An incentive spirometer is a tool that  can help keep your lungs clear and active. This tool measures how well you are filling your lungs with each breath. Taking long deep breaths may help reverse or decrease the chance of developing breathing (pulmonary) problems (especially infection) following:  A long period of time when you are unable to move or be active. BEFORE THE PROCEDURE   If the spirometer includes an indicator to show your best effort, your nurse or respiratory therapist will set it to a desired goal.  If possible, sit up straight or lean slightly forward. Try not to slouch.  Hold the incentive spirometer in an upright position. INSTRUCTIONS FOR USE  1. Sit on the edge of your bed if possible, or sit up as far as you can in bed or on a chair. 2. Hold the incentive spirometer in an upright position. 3. Breathe out normally. 4. Place the mouthpiece in your mouth and seal your lips tightly around it. 5. Breathe in slowly and as deeply as possible, raising the piston or the ball toward the top of the column. 6. Hold your breath for 3-5 seconds or for as long as possible. Allow the piston or ball to fall to the bottom of the column. 7. Remove the mouthpiece from your mouth and breathe out normally. 8. Rest for a few seconds and repeat Steps 1 through 7 at least 10 times every 1-2 hours when you are awake. Take your time and take a few normal breaths between deep breaths. 9. The spirometer may include an indicator to show your best effort. Use the indicator as a goal to work toward during each repetition. 10. After each set of 10 deep breaths, practice coughing to be sure your lungs are clear. If you have an incision (the cut made at the time of surgery), support your incision when coughing by placing a pillow or rolled up towels firmly against it. Once you are able to get out of bed, walk around indoors and cough well. You may stop using the incentive spirometer when instructed by your caregiver.  RISKS AND  COMPLICATIONS  Take your time so you do not  get dizzy or light-headed.  If you are in pain, you may need to take or ask for pain medication before doing incentive spirometry. It is harder to take a deep breath if you are having pain. AFTER USE  Rest and breathe slowly and easily.  It can be helpful to keep track of a log of your progress. Your caregiver can provide you with a simple table to help with this. If you are using the spirometer at home, follow these instructions: Comfort IF:   You are having difficultly using the spirometer.  You have trouble using the spirometer as often as instructed.  Your pain medication is not giving enough relief while using the spirometer.  You develop fever of 100.5 F (38.1 C) or higher. SEEK IMMEDIATE MEDICAL CARE IF:   You cough up bloody sputum that had not been present before.  You develop fever of 102 F (38.9 C) or greater.  You develop worsening pain at or near the incision site. MAKE SURE YOU:   Understand these instructions.  Will watch your condition.  Will get help right away if you are not doing well or get worse. Document Released: 09/10/2006 Document Revised: 07/23/2011 Document Reviewed: 11/11/2006 Exit[employer] Patient Information 2014 [employer], [place].   ________________________________________________________________________  WHAT IS A BLOOD TRANSFUSION? Blood Transfusion Information  A transfusion is the replacement of blood or some of its parts. Blood is made up of multiple cells which provide different functions.  Red blood cells carry oxygen and are used for blood loss replacement.  White blood cells fight against infection.  Platelets control bleeding.  Plasma helps clot blood.  Other blood products are available for specialized needs, such as hemophilia or other clotting disorders. BEFORE THE TRANSFUSION  Who gives blood for transfusions?   Healthy volunteers who are fully evaluated to make sure  their blood is safe. This is blood bank blood. Transfusion therapy is the safest it has ever been in the practice of medicine. Before blood is taken from a donor, a complete history is taken to make sure that person has no history of diseases nor engages in risky social behavior (examples are intravenous drug use or sexual activity with multiple partners). The donor's travel history is screened to minimize risk of transmitting infections, such as malaria. The donated blood is tested for signs of infectious diseases, such as HIV and hepatitis. The blood is then tested to be sure it is compatible with you in order to minimize the chance of a transfusion reaction. If you or a relative donates blood, this is often done in anticipation of surgery and is not appropriate for emergency situations. It takes many days to process the donated blood. RISKS AND COMPLICATIONS Although transfusion therapy is very safe and saves many lives, the main dangers of transfusion include:   Getting an infectious disease.  Developing a transfusion reaction. This is an allergic reaction to something in the blood you were given. Every precaution is taken to prevent this. The decision to have a blood transfusion has been considered carefully by your caregiver before blood is given. Blood is not given unless the benefits outweigh the risks. AFTER THE TRANSFUSION  Right after receiving a blood transfusion, you will usually feel much better and more energetic. This is especially true if your red blood cells have gotten low (anemic). The transfusion raises the level of the red blood cells which carry oxygen, and this usually causes an energy increase.  The nurse administering the transfusion will  monitor you carefully for complications. HOME CARE INSTRUCTIONS  No special instructions are needed after a transfusion. You may find your energy is better. Speak with your caregiver about any limitations on activity for underlying diseases  you may have. SEEK MEDICAL CARE IF:   Your condition is not improving after your transfusion.  You develop redness or irritation at the intravenous (IV) site. SEEK IMMEDIATE MEDICAL CARE IF:  Any of the following symptoms occur over the next 12 hours:  Shaking chills.  You have a temperature by mouth above 102 F (38.9 C), not controlled by medicine.  Chest, back, or muscle pain.  People around you feel you are not acting correctly or are confused.  Shortness of breath or difficulty breathing.  Dizziness and fainting.  You get a rash or develop hives.  You have a decrease in urine output.  Your urine turns a dark color or changes to pink, red, or brown. Any of the following symptoms occur over the next 10 days:  You have a temperature by mouth above 102 F (38.9 C), not controlled by medicine.  Shortness of breath.  Weakness after normal activity.  The white part of the eye turns yellow (jaundice).  You have a decrease in the amount of urine or are urinating less often.  Your urine turns a dark color or changes to pink, red, or brown. Document Released: 04/27/2000 Document Revised: 07/23/2011 Document Reviewed: 12/15/2007 [facility] Patient Information 2014 [place], [place].  _______________________________________________________________________

## 2013-12-10 ENCOUNTER — Encounter (HOSPITAL_COMMUNITY): Payer: Medicare PPO | Admitting: Anesthesiology

## 2013-12-10 ENCOUNTER — Inpatient Hospital Stay (HOSPITAL_COMMUNITY): Payer: Medicare PPO | Admitting: Anesthesiology

## 2013-12-10 ENCOUNTER — Encounter (HOSPITAL_COMMUNITY)
Admission: RE | Disposition: A | Discharge status: Home Health | Payer: Self-pay | Source: Ambulatory Visit | Attending: Orthopedic Surgery

## 2013-12-10 ENCOUNTER — Inpatient Hospital Stay (HOSPITAL_COMMUNITY)
Admission: RE | Admit: 2013-12-10 | Discharge: 2013-12-13 | DRG: 470 | Disposition: A | Discharge status: Home Health | Payer: Medicare PPO | Source: Ambulatory Visit | Attending: Orthopedic Surgery | Admitting: Orthopedic Surgery

## 2013-12-10 ENCOUNTER — Encounter (HOSPITAL_COMMUNITY): Payer: Self-pay | Admitting: *Deleted

## 2013-12-10 DIAGNOSIS — Z96659 Presence of unspecified artificial knee joint: Secondary | ICD-10-CM

## 2013-12-10 DIAGNOSIS — D62 Acute posthemorrhagic anemia: Secondary | ICD-10-CM | POA: Diagnosis not present

## 2013-12-10 DIAGNOSIS — M1710 Unilateral primary osteoarthritis, unspecified knee: Principal | ICD-10-CM | POA: Diagnosis present

## 2013-12-10 DIAGNOSIS — E039 Hypothyroidism, unspecified: Secondary | ICD-10-CM | POA: Diagnosis present

## 2013-12-10 DIAGNOSIS — M25569 Pain in unspecified knee: Secondary | ICD-10-CM | POA: Diagnosis present

## 2013-12-10 DIAGNOSIS — Z79899 Other long term (current) drug therapy: Secondary | ICD-10-CM | POA: Diagnosis not present

## 2013-12-10 DIAGNOSIS — Z8673 Personal history of transient ischemic attack (TIA), and cerebral infarction without residual deficits: Secondary | ICD-10-CM | POA: Diagnosis not present

## 2013-12-10 DIAGNOSIS — K219 Gastro-esophageal reflux disease without esophagitis: Secondary | ICD-10-CM | POA: Diagnosis present

## 2013-12-10 DIAGNOSIS — M1711 Unilateral primary osteoarthritis, right knee: Secondary | ICD-10-CM

## 2013-12-10 DIAGNOSIS — Z96651 Presence of right artificial knee joint: Secondary | ICD-10-CM

## 2013-12-10 HISTORY — PX: TOTAL KNEE ARTHROPLASTY: SHX125

## 2013-12-10 LAB — TYPE AND SCREEN
ABO/RH(D): O POS
Antibody Screen: NEGATIVE

## 2013-12-10 SURGERY — ARTHROPLASTY, KNEE, TOTAL
Anesthesia: General | Site: Knee | Laterality: Right

## 2013-12-10 MED ORDER — DEXTROSE 5 % IV SOLN
500.0000 mg | Freq: Four times a day (QID) | INTRAVENOUS | Status: DC | PRN
  Administered 2013-12-10: 500 mg via INTRAVENOUS
  Filled 2013-12-10: qty 5

## 2013-12-10 MED ORDER — ACETAMINOPHEN 650 MG RE SUPP: 650.0000 mg | Freq: Four times a day (QID) | RECTAL | Status: DC | PRN

## 2013-12-10 MED ORDER — SODIUM CHLORIDE 0.9 % IJ SOLN
INTRAMUSCULAR | Status: DC | PRN
  Administered 2013-12-10: 20 mL

## 2013-12-10 MED ORDER — LORAZEPAM 1 MG PO TABS
2.0000 mg | ORAL_TABLET | Freq: Every evening | ORAL | Status: DC | PRN
  Administered 2013-12-11: 2 mg via ORAL
  Filled 2013-12-10: qty 2

## 2013-12-10 MED ORDER — SUCCINYLCHOLINE CHLORIDE 20 MG/ML IJ SOLN
INTRAMUSCULAR | Status: DC | PRN
  Administered 2013-12-10: 100 mg via INTRAVENOUS

## 2013-12-10 MED ORDER — MENTHOL 3 MG MT LOZG: 1.0000 | LOZENGE | OROMUCOSAL | Status: DC | PRN

## 2013-12-10 MED ORDER — ATORVASTATIN CALCIUM 10 MG PO TABS
10.0000 mg | ORAL_TABLET | Freq: Every day | ORAL | Status: DC
  Administered 2013-12-11 – 2013-12-12 (×2): 10 mg via ORAL
  Filled 2013-12-10 (×3): qty 1

## 2013-12-10 MED ORDER — ALUM & MAG HYDROXIDE-SIMETH 200-200-20 MG/5ML PO SUSP: 30.0000 mL | ORAL | Status: DC | PRN

## 2013-12-10 MED ORDER — BUPIVACAINE LIPOSOME 1.3 % IJ SUSP
20.0000 mL | Freq: Once | INTRAMUSCULAR | Status: AC
  Administered 2013-12-10: 20 mL
  Filled 2013-12-10: qty 20

## 2013-12-10 MED ORDER — HYDROMORPHONE HCL PF 1 MG/ML IJ SOLN: 0.2500 mg | INTRAMUSCULAR | Status: DC | PRN

## 2013-12-10 MED ORDER — ONDANSETRON HCL 4 MG/2ML IJ SOLN
INTRAMUSCULAR | Status: AC
  Filled 2013-12-10: qty 2

## 2013-12-10 MED ORDER — SODIUM CHLORIDE 0.9 % IJ SOLN
INTRAMUSCULAR | Status: AC
  Filled 2013-12-10: qty 10

## 2013-12-10 MED ORDER — FENTANYL CITRATE 0.05 MG/ML IJ SOLN
INTRAMUSCULAR | Status: AC
  Filled 2013-12-10: qty 5

## 2013-12-10 MED ORDER — LACTATED RINGERS IV SOLN
INTRAVENOUS | Status: DC
  Administered 2013-12-10 (×2): via INTRAVENOUS
  Administered 2013-12-10: 1000 mL via INTRAVENOUS

## 2013-12-10 MED ORDER — PHENOL 1.4 % MT LIQD: 1.0000 | OROMUCOSAL | Status: DC | PRN

## 2013-12-10 MED ORDER — CEFAZOLIN SODIUM-DEXTROSE 2-3 GM-% IV SOLR
2.0000 g | INTRAVENOUS | Status: AC
  Administered 2013-12-10: 2 g via INTRAVENOUS

## 2013-12-10 MED ORDER — PROMETHAZINE HCL 25 MG/ML IJ SOLN: 6.2500 mg | INTRAMUSCULAR | Status: DC | PRN

## 2013-12-10 MED ORDER — BISACODYL 5 MG PO TBEC: 5.0000 mg | DELAYED_RELEASE_TABLET | Freq: Every day | ORAL | Status: DC | PRN

## 2013-12-10 MED ORDER — FLEET ENEMA 7-19 GM/118ML RE ENEM: 1.0000 | ENEMA | Freq: Once | RECTAL | Status: AC | PRN

## 2013-12-10 MED ORDER — PAROXETINE HCL 30 MG PO TABS
30.0000 mg | ORAL_TABLET | Freq: Every morning | ORAL | Status: DC
  Administered 2013-12-11 – 2013-12-13 (×3): 30 mg via ORAL
  Filled 2013-12-10 (×3): qty 1

## 2013-12-10 MED ORDER — THROMBIN 5000 UNITS EX SOLR
CUTANEOUS | Status: AC
  Filled 2013-12-10: qty 5000

## 2013-12-10 MED ORDER — MIDAZOLAM HCL 2 MG/2ML IJ SOLN
INTRAMUSCULAR | Status: AC
  Filled 2013-12-10: qty 2

## 2013-12-10 MED ORDER — ONDANSETRON HCL 4 MG/2ML IJ SOLN: 4.0000 mg | Freq: Four times a day (QID) | INTRAMUSCULAR | Status: DC | PRN

## 2013-12-10 MED ORDER — LIDOCAINE HCL (CARDIAC) 20 MG/ML IV SOLN
INTRAVENOUS | Status: AC
  Filled 2013-12-10: qty 5

## 2013-12-10 MED ORDER — ROCURONIUM BROMIDE 100 MG/10ML IV SOLN
INTRAVENOUS | Status: DC | PRN
  Administered 2013-12-10: 25 mg via INTRAVENOUS
  Administered 2013-12-10: 5 mg via INTRAVENOUS

## 2013-12-10 MED ORDER — MIDAZOLAM HCL 5 MG/5ML IJ SOLN
INTRAMUSCULAR | Status: DC | PRN
  Administered 2013-12-10: 2 mg via INTRAVENOUS

## 2013-12-10 MED ORDER — RIVAROXABAN 10 MG PO TABS
10.0000 mg | ORAL_TABLET | Freq: Every day | ORAL | Status: DC
  Administered 2013-12-11 – 2013-12-13 (×3): 10 mg via ORAL
  Filled 2013-12-10 (×4): qty 1

## 2013-12-10 MED ORDER — ROCURONIUM BROMIDE 100 MG/10ML IV SOLN
INTRAVENOUS | Status: AC
  Filled 2013-12-10: qty 1

## 2013-12-10 MED ORDER — SODIUM CHLORIDE 0.9 % IR SOLN
Status: DC | PRN
  Administered 2013-12-10: 14:00:00

## 2013-12-10 MED ORDER — THROMBIN 5000 UNITS EX SOLR
CUTANEOUS | Status: DC | PRN
  Administered 2013-12-10: 5000 [IU] via TOPICAL

## 2013-12-10 MED ORDER — FENTANYL CITRATE 0.05 MG/ML IJ SOLN
INTRAMUSCULAR | Status: DC | PRN
  Administered 2013-12-10 (×4): 50 ug via INTRAVENOUS

## 2013-12-10 MED ORDER — DEXAMETHASONE SODIUM PHOSPHATE 10 MG/ML IJ SOLN
INTRAMUSCULAR | Status: DC | PRN
  Administered 2013-12-10: 10 mg via INTRAVENOUS

## 2013-12-10 MED ORDER — PROPOFOL 10 MG/ML IV BOLUS
INTRAVENOUS | Status: DC | PRN
  Administered 2013-12-10: 160 mg via INTRAVENOUS

## 2013-12-10 MED ORDER — ONDANSETRON HCL 4 MG/2ML IJ SOLN
INTRAMUSCULAR | Status: DC | PRN
  Administered 2013-12-10: 4 mg via INTRAVENOUS

## 2013-12-10 MED ORDER — CELECOXIB 200 MG PO CAPS
200.0000 mg | ORAL_CAPSULE | Freq: Two times a day (BID) | ORAL | Status: DC
  Administered 2013-12-11 – 2013-12-13 (×5): 200 mg via ORAL
  Filled 2013-12-10 (×7): qty 1

## 2013-12-10 MED ORDER — CEFAZOLIN SODIUM-DEXTROSE 2-3 GM-% IV SOLR
2.0000 g | Freq: Four times a day (QID) | INTRAVENOUS | Status: AC
  Administered 2013-12-10 – 2013-12-11 (×2): 2 g via INTRAVENOUS
  Filled 2013-12-10 (×2): qty 50

## 2013-12-10 MED ORDER — LEVOTHYROXINE SODIUM 88 MCG PO TABS
88.0000 ug | ORAL_TABLET | Freq: Every day | ORAL | Status: DC
  Administered 2013-12-11 – 2013-12-13 (×3): 88 ug via ORAL
  Filled 2013-12-10 (×4): qty 1

## 2013-12-10 MED ORDER — HYDROMORPHONE HCL PF 1 MG/ML IJ SOLN
1.0000 mg | INTRAMUSCULAR | Status: DC | PRN
  Administered 2013-12-10 – 2013-12-11 (×3): 1 mg via INTRAVENOUS
  Filled 2013-12-10 (×3): qty 1

## 2013-12-10 MED ORDER — NEOSTIGMINE METHYLSULFATE 10 MG/10ML IV SOLN
INTRAVENOUS | Status: DC | PRN
  Administered 2013-12-10: 4 mg via INTRAVENOUS

## 2013-12-10 MED ORDER — HYDROMORPHONE HCL PF 2 MG/ML IJ SOLN
INTRAMUSCULAR | Status: AC
  Filled 2013-12-10: qty 1

## 2013-12-10 MED ORDER — OMEPRAZOLE 20 MG PO CPDR
20.0000 mg | DELAYED_RELEASE_CAPSULE | Freq: Every day | ORAL | Status: DC
  Administered 2013-12-11 – 2013-12-13 (×3): 20 mg via ORAL
  Filled 2013-12-10 (×3): qty 1

## 2013-12-10 MED ORDER — DEXAMETHASONE SODIUM PHOSPHATE 10 MG/ML IJ SOLN
INTRAMUSCULAR | Status: AC
  Filled 2013-12-10: qty 1

## 2013-12-10 MED ORDER — FERROUS SULFATE 325 (65 FE) MG PO TABS
325.0000 mg | ORAL_TABLET | Freq: Three times a day (TID) | ORAL | Status: DC
  Administered 2013-12-11 – 2013-12-13 (×7): 325 mg via ORAL
  Filled 2013-12-10 (×11): qty 1

## 2013-12-10 MED ORDER — ONDANSETRON HCL 4 MG PO TABS: 4.0000 mg | ORAL_TABLET | Freq: Four times a day (QID) | ORAL | Status: DC | PRN

## 2013-12-10 MED ORDER — ACETAMINOPHEN 10 MG/ML IV SOLN
1000.0000 mg | Freq: Once | INTRAVENOUS | Status: AC
  Administered 2013-12-10: 1000 mg via INTRAVENOUS
  Filled 2013-12-10: qty 100

## 2013-12-10 MED ORDER — PROPOFOL 10 MG/ML IV BOLUS
INTRAVENOUS | Status: AC
  Filled 2013-12-10: qty 20

## 2013-12-10 MED ORDER — METHOCARBAMOL 500 MG PO TABS
500.0000 mg | ORAL_TABLET | Freq: Four times a day (QID) | ORAL | Status: DC | PRN
  Administered 2013-12-11 – 2013-12-13 (×2): 500 mg via ORAL
  Filled 2013-12-10 (×2): qty 1

## 2013-12-10 MED ORDER — HYDROMORPHONE HCL PF 1 MG/ML IJ SOLN
INTRAMUSCULAR | Status: DC | PRN
  Administered 2013-12-10 (×2): 1 mg via INTRAVENOUS

## 2013-12-10 MED ORDER — CEFAZOLIN SODIUM-DEXTROSE 2-3 GM-% IV SOLR
INTRAVENOUS | Status: AC
  Filled 2013-12-10: qty 50

## 2013-12-10 MED ORDER — ACETAMINOPHEN 325 MG PO TABS
650.0000 mg | ORAL_TABLET | Freq: Four times a day (QID) | ORAL | Status: DC | PRN
  Administered 2013-12-11 – 2013-12-13 (×3): 650 mg via ORAL
  Filled 2013-12-10 (×3): qty 2

## 2013-12-10 MED ORDER — MEPERIDINE HCL 50 MG/ML IJ SOLN: 6.2500 mg | INTRAMUSCULAR | Status: DC | PRN

## 2013-12-10 MED ORDER — LACTATED RINGERS IV SOLN
INTRAVENOUS | Status: DC
  Administered 2013-12-10 – 2013-12-11 (×3): via INTRAVENOUS

## 2013-12-10 MED ORDER — LIDOCAINE HCL (CARDIAC) 20 MG/ML IV SOLN
INTRAVENOUS | Status: DC | PRN
  Administered 2013-12-10: 50 mg via INTRAVENOUS

## 2013-12-10 MED ORDER — POLYETHYLENE GLYCOL 3350 17 G PO PACK
17.0000 g | PACK | Freq: Every day | ORAL | Status: DC | PRN
  Administered 2013-12-12: 17 g via ORAL
  Filled 2013-12-10: qty 1

## 2013-12-10 MED ORDER — GLYCOPYRROLATE 0.2 MG/ML IJ SOLN
INTRAMUSCULAR | Status: DC | PRN
  Administered 2013-12-10: 0.6 mg via INTRAVENOUS

## 2013-12-10 MED ORDER — DARIFENACIN HYDROBROMIDE ER 7.5 MG PO TB24
7.5000 mg | ORAL_TABLET | Freq: Every day | ORAL | Status: DC
  Administered 2013-12-11 – 2013-12-13 (×3): 7.5 mg via ORAL
  Filled 2013-12-10 (×3): qty 1

## 2013-12-10 SURGICAL SUPPLY — 68 items
BAG ZIPLOCK 12X15 (MISCELLANEOUS) IMPLANT
BANDAGE ELASTIC 4 VELCRO ST LF (GAUZE/BANDAGES/DRESSINGS) ×2 IMPLANT
BANDAGE ELASTIC 6 VELCRO ST LF (GAUZE/BANDAGES/DRESSINGS) ×2 IMPLANT
BANDAGE ESMARK 6X9 LF (GAUZE/BANDAGES/DRESSINGS) ×1 IMPLANT
BLADE SAG 18X100X1.27 (BLADE) ×2 IMPLANT
BLADE SAW SGTL 11.0X1.19X90.0M (BLADE) ×2 IMPLANT
BNDG ESMARK 6X9 LF (GAUZE/BANDAGES/DRESSINGS) ×2
BONE CEMENT GENTAMICIN (Cement) ×4 IMPLANT
CAPT RP KNEE ×2 IMPLANT
CEMENT BONE GENTAMICIN 40 (Cement) ×2 IMPLANT
CUFF TOURN SGL QUICK 34 (TOURNIQUET CUFF) ×1
CUFF TRNQT CYL 34X4X40X1 (TOURNIQUET CUFF) ×1 IMPLANT
DERMABOND ADVANCED (GAUZE/BANDAGES/DRESSINGS)
DERMABOND ADVANCED .7 DNX12 (GAUZE/BANDAGES/DRESSINGS) IMPLANT
DRAPE EXTREMITY T 121X128X90 (DRAPE) ×2 IMPLANT
DRAPE INCISE IOBAN 66X45 STRL (DRAPES) ×2 IMPLANT
DRAPE LG THREE QUARTER DISP (DRAPES) ×2 IMPLANT
DRAPE POUCH INSTRU U-SHP 10X18 (DRAPES) ×2 IMPLANT
DRAPE U-SHAPE 47X51 STRL (DRAPES) ×2 IMPLANT
DRSG AQUACEL AG ADV 3.5X10 (GAUZE/BANDAGES/DRESSINGS) ×2 IMPLANT
DRSG OPSITE POSTOP 3X4 (GAUZE/BANDAGES/DRESSINGS) ×2 IMPLANT
DRSG PAD ABDOMINAL 8X10 ST (GAUZE/BANDAGES/DRESSINGS) IMPLANT
DRSG TEGADERM 4X4.75 (GAUZE/BANDAGES/DRESSINGS) IMPLANT
DURAPREP 26ML APPLICATOR (WOUND CARE) ×2 IMPLANT
ELECT REM PT RETURN 9FT ADLT (ELECTROSURGICAL) ×2
ELECTRODE REM PT RTRN 9FT ADLT (ELECTROSURGICAL) ×1 IMPLANT
EVACUATOR 1/8 PVC DRAIN (DRAIN) ×2 IMPLANT
FACESHIELD WRAPAROUND (MASK) ×10 IMPLANT
GAUZE SPONGE 2X2 8PLY STRL LF (GAUZE/BANDAGES/DRESSINGS) ×1 IMPLANT
GLOVE BIOGEL PI IND STRL 6.5 (GLOVE) ×1 IMPLANT
GLOVE BIOGEL PI IND STRL 8 (GLOVE) ×1 IMPLANT
GLOVE BIOGEL PI INDICATOR 6.5 (GLOVE) ×1
GLOVE BIOGEL PI INDICATOR 8 (GLOVE) ×1
GLOVE ECLIPSE 8.0 STRL XLNG CF (GLOVE) ×4 IMPLANT
GLOVE SURG SS PI 6.5 STRL IVOR (GLOVE) ×2 IMPLANT
GOWN STRL REUS W/TWL LRG LVL3 (GOWN DISPOSABLE) ×2 IMPLANT
GOWN STRL REUS W/TWL XL LVL3 (GOWN DISPOSABLE) ×2 IMPLANT
HANDPIECE INTERPULSE COAX TIP (DISPOSABLE) ×1
IMMOBILIZER KNEE 20 (SOFTGOODS) ×4 IMPLANT
IMMOBILIZER KNEE 20 THIGH 36 (SOFTGOODS) ×1 IMPLANT
KIT BASIN OR (CUSTOM PROCEDURE TRAY) ×2 IMPLANT
MANIFOLD NEPTUNE II (INSTRUMENTS) ×2 IMPLANT
NEEDLE HYPO 22GX1.5 SAFETY (NEEDLE) ×2 IMPLANT
NS IRRIG 1000ML POUR BTL (IV SOLUTION) IMPLANT
PACK TOTAL JOINT (CUSTOM PROCEDURE TRAY) ×2 IMPLANT
PADDING CAST COTTON 6X4 STRL (CAST SUPPLIES) ×2 IMPLANT
POSITIONER SURGICAL ARM (MISCELLANEOUS) ×2 IMPLANT
SET HNDPC FAN SPRY TIP SCT (DISPOSABLE) ×1 IMPLANT
SET PAD KNEE POSITIONER (MISCELLANEOUS) ×2 IMPLANT
SPONGE GAUZE 2X2 STER 10/PKG (GAUZE/BANDAGES/DRESSINGS) ×1
SPONGE LAP 18X18 X RAY DECT (DISPOSABLE) IMPLANT
SPONGE SURGIFOAM ABS GEL 100 (HEMOSTASIS) ×2 IMPLANT
STAPLER VISISTAT 35W (STAPLE) IMPLANT
SUCTION FRAZIER 12FR DISP (SUCTIONS) ×2 IMPLANT
SUT BONE WAX W31G (SUTURE) ×2 IMPLANT
SUT MNCRL AB 4-0 PS2 18 (SUTURE) ×2 IMPLANT
SUT VIC AB 1 CT1 27 (SUTURE) ×2
SUT VIC AB 1 CT1 27XBRD ANTBC (SUTURE) ×2 IMPLANT
SUT VIC AB 2-0 CT1 27 (SUTURE) ×3
SUT VIC AB 2-0 CT1 TAPERPNT 27 (SUTURE) ×3 IMPLANT
SUT VLOC 180 0 24IN GS25 (SUTURE) ×2 IMPLANT
SYRINGE 20CC LL (MISCELLANEOUS) ×2 IMPLANT
TOWEL OR 17X26 10 PK STRL BLUE (TOWEL DISPOSABLE) ×2 IMPLANT
TOWEL OR NON WOVEN STRL DISP B (DISPOSABLE) IMPLANT
TOWER CARTRIDGE SMART MIX (DISPOSABLE) ×2 IMPLANT
TRAY FOLEY CATH 14FRSI W/METER (CATHETERS) ×2 IMPLANT
WATER STERILE IRR 1500ML POUR (IV SOLUTION) ×2 IMPLANT
WRAP KNEE MAXI GEL POST OP (GAUZE/BANDAGES/DRESSINGS) ×2 IMPLANT

## 2013-12-10 NOTE — Anesthesia Preprocedure Evaluation (Signed)
Anesthesia Evaluation  Patient identified by MRN, date of birth, ID band Patient awake    Reviewed: Allergy & Precautions, H&P , NPO status , Patient's Chart, lab work & pertinent test results  Airway Mallampati: II TM Distance: >3 FB Neck ROM: Full    Dental no notable dental hx. (+) Edentulous Upper, Edentulous Lower, Dental Advisory Given   Pulmonary sleep apnea ,  breath sounds clear to auscultation  Pulmonary exam normal       Cardiovascular negative cardio ROS  Rhythm:Regular Rate:Normal     Neuro/Psych  Headaches, PSYCHIATRIC DISORDERS Depression CVA, Residual Symptoms    GI/Hepatic negative GI ROS, Neg liver ROS, GERD-  Medicated and Controlled,  Endo/Other  negative endocrine ROSHypothyroidism   Renal/GU negative Renal ROS     Musculoskeletal negative musculoskeletal ROS (+)   Abdominal   Peds  Hematology negative hematology ROS (+)   Anesthesia Other Findings   Reproductive/Obstetrics negative OB ROS                           Anesthesia Physical  Anesthesia Plan  ASA: II  Anesthesia Plan: General   Post-op Pain Management:    Induction: Intravenous  Airway Management Planned: Oral ETT  Additional Equipment:   Intra-op Plan:   Post-operative Plan: Extubation in OR  Informed Consent: I have reviewed the patients History and Physical, chart, labs and discussed the procedure including the risks, benefits and alternatives for the proposed anesthesia with the patient or authorized representative who has indicated his/her understanding and acceptance.   Dental advisory given  Plan Discussed with: CRNA  Anesthesia Plan Comments:         Anesthesia Quick Evaluation

## 2013-12-10 NOTE — Op Note (Signed)
Maria Henry, Maria Henry               ACCOUNT NO.:  [id number]  MEDICAL RECORD NO.:  [id number]  LOCATION:  1604                         FACILITY:  [facility]  PHYSICIAN:  Georges Lynch. Annamarie Yamaguchi, M.D.DATE OF BIRTH:  May 24, 1940  DATE OF PROCEDURE:  12/10/2013 DATE OF DISCHARGE:                              OPERATIVE REPORT   SURGEON:  Georges Lynch. Chloie Loney, M.D.  ASSISTANT:  Dimitri Ped, [place]  PREOPERATIVE DIAGNOSIS:  Severe degenerative arthritis with bone on bone of the right knee.  POSTOPERATIVE DIAGNOSIS:  Severe degenerative arthritis with bone on bone of the right knee.  OPERATION:  Right total knee arthroplasty utilizing DePuy system.  Sizes used was a size 3 right femoral component, posterior sacrificing type. The tibial tray size 2.  The insert was a rotating platform, size 3, 10- mm thickness.  The patella was size 35, with three pegs.  All three components were cemented.  Gentamicin was used in the cement.  DESCRIPTION OF PROCEDURE:  Under general anesthesia, routine orthopedic prepping and draping was carried out of the right lower extremity.  The appropriate time-out was first carried out.  I also marked the appropriate right leg in the holding area.  At this particular time, the leg was exsanguinated and Esmarch tourniquet was elevated to 325 mmHg. The knee was placed in a [facility].  The knee was flexed and anterior approach to knee was carried out.  Bleeders were identified and cauterized.  At this time, two flaps were created.  Following that, I carried out a median parapatellar incision.  I reflected the patella laterally.  I then did medial and lateral meniscectomies and excised the anterior-posterior cruciate ligaments.  Following that, initial drill hole was made in the intercondylar notch and a drill hole was made in the intercondylar notch.  Following that, I removed 12-mm thickness off the distal femur.  The femur that was measured to be a size 3 right.   I then did my anterior, posterior, and chamfering cuts at that time. Following that, I then did a cut on the tibial plateau in the usual fashion.  After drill hole was made into the tibial plateau, we removed 6-mm thickness off the tibial plateau.  At that particular time, we then inserted our lamina spreaders, removed posterior spurs.  We then removed the lamina spreaders, then inserted our trial spacers.  We had an excellent fit with the 10-mm thickness.  Following that, I then cut my keel cut in the usual fashion and cut my femoral notch cut in the usual fashion.  Trial components were cemented.  I did a resurfacing procedure on the patella.  At that time, three drill holes were made in the patella for a size 35-mm patella.  All trial components were removed. We thoroughly water picked the knee, cemented all three components in simultaneously, and gentamicin was used in the cement.  Once cement was hardened, we thoroughly removed all pieces of cement, water picked the knee out to make sure there were no other loose fragments in the cement present.  I then inserted thrombin-soaked Gelfoam.  I then went on and inserted the Hemovac drain and closed the wound layers in  usual fashion.  Note, the trial component was obviously removed before we inserted our permanent rotating platform.  Sterile dressings were applied.  She had 2 g of IV Ancef.  Note, as I mentioned, we did do a time-out before the procedure.  Also, I marked the appropriate right leg in the holding area.          ______________________________ Georges Lynchonald A. Darrelyn HillockGioffre, M.D.     RAG/MEDQ  D:  12/10/2013  T:  12/10/2013  Job:  [id number]

## 2013-12-10 NOTE — Brief Op Note (Signed)
12/10/2013  2:48 PM  PATIENT:  Ramond CraverBrenda J Fuster  73 y.o. female  PRE-OPERATIVE DIAGNOSIS:  OA OF RIGHT KNEE  POST-OPERATIVE DIAGNOSIS:  OA OF RIGHT KNEE  PROCEDURE:  Procedure(s): RIGHT TOTAL KNEE ARTHROPLASTY (Right)  SURGEON:  Surgeon(s) and Role:    * Jacki Conesonald A Anesia Blackwell, MD - Primary  PHYSICIAN ASSISTANT: Dimitri PedAmber Constable PA  ASSISTANTS: Dimitri PedAmber Constable PA   ANESTHESIA:   general  EBL:  Total I/O In: 2000 [I.V.:2000] Out: 150 [Urine:150]  BLOOD ADMINISTERED:none  DRAINS: (One) Hemovact drain(s) in the Right Knee with  Suction Open   LOCAL MEDICATIONS USED:  BUPIVICAINE20cc mixed with 20cc normal Saline.   SPECIMEN:  No Specimen  DISPOSITION OF SPECIMEN:  N/A  COUNTS:  YES  TOURNIQUET:  * Missing tourniquet times found for documented tourniquets in log:  [id number] *  DICTATION: .Other Dictation: Dictation Number [id number]  PLAN OF CARE: Admit to inpatient   PATIENT DISPOSITION:  Stable in OR   Delay start of Pharmacological VTE agent (>24hrs) due to surgical blood loss or risk of bleeding: yes

## 2013-12-10 NOTE — Progress Notes (Signed)
12/10/13 1530  PACU Vital Signs  Temp 98.1 F (36.7 C)  Temperature Source Temporal  Pulse Rate 93  ECG Heart Rate 93  Cardiac Rhythm NSR  Resp 18  BP ! 144/51 mmHg  BP Location Right arm  BP Method Automatic  Patient Position (if appropriate) Lying  Oxygen Therapy  SpO2 92 %  O2 Device Simple mask  O2 Flow Rate (L/min) 13 L/min  End Tidal CO2 (EtCO2) 34  pt arrived from OR, oral airway in place, pt needing stimulation to arouse and deep breathe. Nasal airway inserted as well to help maintain sats. Anesthesia @ BS. O2 sats maintaining 91-92%. Will monitor.

## 2013-12-10 NOTE — Transfer of Care (Signed)
Immediate Anesthesia Transfer of Care Note  Patient: Maria CraverBrenda J Henry  Procedure(s) Performed: Procedure(s) (LRB): RIGHT TOTAL KNEE ARTHROPLASTY (Right)  Patient Location: PACU  Anesthesia Type: General  Level of Consciousness: sedated, patient cooperative and responds to stimulation  Airway & Oxygen Therapy: Patient Spontanous Breathing and Patient connected to face mask oxgen  Post-op Assessment: Report given to PACU RN and Post -op Vital signs reviewed and stable  Post vital signs: Reviewed and stable  Complications: No apparent anesthesia complications

## 2013-12-10 NOTE — Plan of Care (Signed)
Problem: Consults Goal: Diagnosis- Total Joint Replacement Primary Total Knee     

## 2013-12-10 NOTE — Interval H&P Note (Signed)
History and Physical Interval Note:  12/10/2013 12:43 PM  Maria Henry  has presented today for surgery, with the diagnosis of OA OF RIGHT KNEE  The various methods of treatment have been discussed with the patient and family. After consideration of risks, benefits and other options for treatment, the patient has consented to  Procedure(s): RIGHT TOTAL KNEE ARTHROPLASTY (Right) as a surgical intervention .  The patient's history has been reviewed, patient examined, no change in status, stable for surgery.  I have reviewed the patient's chart and labs.  Questions were answered to the patient's satisfaction.     [name] A

## 2013-12-10 NOTE — Anesthesia Postprocedure Evaluation (Signed)
Anesthesia Post Note  Patient: Ramond CraverBrenda J Ruland  Procedure(s) Performed: Procedure(s) (LRB): RIGHT TOTAL KNEE ARTHROPLASTY (Right)  Anesthesia type: General  Patient location: PACU  Post pain: Pain level controlled  Post assessment: Post-op Vital signs reviewed  Last Vitals: BP 119/58  Pulse 93  Temp(Src) 36.5 C (Oral)  Resp 14  SpO2 92%  Post vital signs: Reviewed  Level of consciousness: sedated  Complications: No apparent anesthesia complications

## 2013-12-11 ENCOUNTER — Encounter (HOSPITAL_COMMUNITY): Payer: Self-pay | Admitting: Orthopedic Surgery

## 2013-12-11 LAB — BASIC METABOLIC PANEL
Anion gap: 12 (ref 5–15)
BUN: 20 mg/dL (ref 6–23)
CALCIUM: 9 mg/dL (ref 8.4–10.5)
CHLORIDE: 103 meq/L (ref 96–112)
CO2: 23 mEq/L (ref 19–32)
CREATININE: 1.35 mg/dL — AB (ref 0.50–1.10)
GFR calc Af Amer: 44 mL/min — ABNORMAL LOW (ref >90)
GFR calc non Af Amer: 38 mL/min — ABNORMAL LOW (ref >90)
Glucose, Bld: 147 mg/dL — ABNORMAL HIGH (ref 70–99)
Potassium: 4.9 mEq/L (ref 3.7–5.3)
Sodium: 138 mEq/L (ref 137–147)

## 2013-12-11 LAB — CBC
HCT: 32.4 % — ABNORMAL LOW (ref 36.0–46.0)
HEMOGLOBIN: 10.9 g/dL — AB (ref 12.0–15.0)
MCH: 31.1 pg (ref 26.0–34.0)
MCHC: 33.6 g/dL (ref 30.0–36.0)
MCV: 92.3 fL (ref 78.0–100.0)
Platelets: 228 10*3/uL (ref 150–400)
RBC: 3.51 MIL/uL — ABNORMAL LOW (ref 3.87–5.11)
RDW: 13.1 % (ref 11.5–15.5)
WBC: 10.1 10*3/uL (ref 4.0–10.5)

## 2013-12-11 MED ORDER — TRAMADOL HCL 50 MG PO TABS
50.0000 mg | ORAL_TABLET | Freq: Four times a day (QID) | ORAL | Status: DC | PRN
  Administered 2013-12-11 – 2013-12-13 (×5): 100 mg via ORAL
  Filled 2013-12-11 (×5): qty 2

## 2013-12-11 MED ORDER — TRAMADOL HCL 50 MG PO TABS: 50.0000 mg | ORAL_TABLET | Freq: Four times a day (QID) | ORAL | Status: AC | PRN

## 2013-12-11 MED ORDER — FERROUS SULFATE 325 (65 FE) MG PO TABS: 325.0000 mg | ORAL_TABLET | Freq: Three times a day (TID) | ORAL | Status: DC

## 2013-12-11 MED ORDER — METHOCARBAMOL 500 MG PO TABS: 500.0000 mg | ORAL_TABLET | Freq: Four times a day (QID) | ORAL | Status: DC | PRN

## 2013-12-11 MED ORDER — RIVAROXABAN 10 MG PO TABS
10.0000 mg | ORAL_TABLET | Freq: Every day | ORAL | Status: DC
Start: 2013-12-11 — End: 2022-09-16

## 2013-12-11 MED ORDER — DOCUSATE SODIUM 100 MG PO CAPS
100.0000 mg | ORAL_CAPSULE | Freq: Two times a day (BID) | ORAL | Status: DC
  Administered 2013-12-11 – 2013-12-13 (×5): 100 mg via ORAL
  Filled 2013-12-11 (×3): qty 1

## 2013-12-11 MED ORDER — DSS 100 MG PO CAPS: 100.0000 mg | ORAL_CAPSULE | Freq: Two times a day (BID) | ORAL | Status: DC

## 2013-12-11 NOTE — Progress Notes (Signed)
CARE MANAGEMENT NOTE 12/11/2013  Patient:  Maria Henry,Maria Henry   Account Number:  [id number]  Date Initiated:  12/11/2013  Documentation initiated by:  Jakia Kennebrew  Subjective/Objective Assessment:   right total knee 07/302015     Action/Plan:   home with hhc is planned for Sunday   Anticipated DC Date:  12/13/2013   Anticipated DC Plan:  HOME W HOME HEALTH SERVICES  In-house referral  NA      DC Planning Services  CM consult      Choice offered to / List presented to:             Status of service:   Medicare Important Message given?  NA - LOS <3 / Initial given by admissions (If response is "NO", the following Medicare IM given date fields will be blank) Date Medicare IM given:   Medicare IM given by:   Date Additional Medicare IM given:   Additional Medicare IM given by:    Discharge Disposition:    Per UR Regulation:  Reviewed for med. necessity/level of care/duration of stay  If discussed at Long Length of Stay Meetings, dates discussed:    Comments:  [id number]/Chrysten Woulfe Lorrin MaisDavis, RN,BSN,CCM: [phone] Chart reviewed for patient status and needs. No discharge needs present at time of review.  Will follow to hhc needs and dme. Next review due on 080215.

## 2013-12-11 NOTE — Progress Notes (Signed)
Subjective: 1 Day Post-Op Procedure(s) (LRB): RIGHT TOTAL KNEE ARTHROPLASTY (Right) Patient reports pain as 2 on 0-10 scale. Doing well. Hemovac DCd.  Wil  DC on Sunday.  Objective: Vital signs in last 24 hours: Temp:  [97.6 F (36.4 C)-98.6 F (37 C)] 98.6 F (37 C) (07/31 0645) Pulse Rate:  [71-93] 91 (07/31 0645) Resp:  [7-19] 10 (07/31 0645) BP: (110-147)/(50-101) 145/63 mmHg (07/31 0645) SpO2:  [91 %-100 %] 94 % (07/31 0645) Weight:  [78.019 kg (172 lb)] 78.019 kg (172 lb) (07/30 2020)  Intake/Output from previous day: 07/30 0701 - 07/31 0700 In: 4091.7 [P.O.:100; I.V.:3836.7; IV Piggyback:155] Out: 1160 [Urine:875; Drains:285] Intake/Output this shift:     Recent Labs  12/11/13 0445  HGB 10.9*    Recent Labs  12/11/13 0445  WBC 10.1  RBC 3.51*  HCT 32.4*  PLT 228    Recent Labs  12/11/13 0445  NA 138  K 4.9  CL 103  CO2 23  BUN 20  CREATININE 1.35*  GLUCOSE 147*  CALCIUM 9.0   No results found for this basename: LABPT, INR,  in the last 72 hours  Dorsiflexion/Plantar flexion intact  Assessment/Plan: 1 Day Post-Op Procedure(s) (LRB): RIGHT TOTAL KNEE ARTHROPLASTY (Right) Up with therapy discontinued Sunday.  Livia Tarr A 12/11/2013, 7:19 AM

## 2013-12-11 NOTE — Evaluation (Signed)
Physical Therapy Evaluation Patient Details Name: Maria Henry MRN: [id number] DOB: 1940/08/07 Today's Date: 12/11/2013   History of Present Illness  L TKR 5/14; R TKR 12/10/13  Clinical Impression      Follow Up Recommendations Home health PT    Equipment Recommendations  None recommended by PT    Recommendations for Other Services OT consult     Precautions / Restrictions Precautions Precautions: Knee;Fall Required Braces or Orthoses: Knee Immobilizer - Right Knee Immobilizer - Right: Discontinue once straight leg raise with < 10 degree lag Restrictions Weight Bearing Restrictions: No Other Position/Activity Restrictions: WBAT      Mobility  Bed Mobility Overal bed mobility: Needs Assistance Bed Mobility: Supine to Sit     Supine to sit: Min assist;Mod assist     General bed mobility comments: cues for sequence and use of L LE to self assist  Transfers Overall transfer level: Needs assistance Equipment used: Rolling walker (2 wheeled) Transfers: Sit to/from Stand Sit to Stand: Mod assist         General transfer comment: cues for LE management and use of UEs to self assist  Ambulation/Gait Ambulation/Gait assistance: Min assist Ambulation Distance (Feet): 54 Feet Assistive device: Rolling walker (2 wheeled) Gait Pattern/deviations: Step-to pattern;Decreased step length - right;Decreased step length - left;Shuffle;Trunk flexed Gait velocity: decr   General Gait Details: cues for posture, stride length, sequence and position from [employer] Mobility    Modified Rankin (Stroke Patients Only)       Balance                                             Pertinent Vitals/Pain 4/10; premed, cold packs provided    Home Living                        Prior Function                 Hand Dominance        Extremity/Trunk Assessment                         Communication       Cognition Arousal/Alertness: Awake/alert Behavior During Therapy: WFL for tasks assessed/performed Overall Cognitive Status: Within Functional Limits for tasks assessed                      General Comments      Exercises        Assessment/Plan    PT Assessment    PT Diagnosis     PT Problem List    PT Treatment Interventions     PT Goals (Current goals can be found in the Care Plan section) Acute Rehab PT Goals Patient Stated Goal: Resume previous lifestyle with decreased pain PT Goal Formulation: With patient Time For Goal Achievement: 12/18/13 Potential to Achieve Goals: Good    Frequency 7X/week   Barriers to discharge        Co-evaluation               End of Session Equipment Utilized During Treatment: Gait belt;Right knee immobilizer Activity Tolerance: Patient tolerated treatment well Patient left: in chair;with nursing/sitter in room;with restraints reapplied;with call bell/phone within reach Nurse Communication: Mobility  status         Time: 4098-11911455-1522 PT Time Calculation (min): 27 min   Charges:     PT Treatments $Gait Training: 23-37 mins   PT G Codes:          Alcario Tinkey 12/11/2013, 3:59 PM

## 2013-12-11 NOTE — Evaluation (Signed)
Physical Therapy Evaluation Patient Details Name: Maria Henry MRN: [id number] DOB: February 25, 1941 Today's Date: 12/11/2013   History of Present Illness  L TKR 5/14; R TKR 12/10/13  Clinical Impression  Pt s/p R TKR presents with decreased R LE strength/ROM and post op pain limiting functional mobility.  Pt should progress to d/c home with assist of family/friends and HHPT follow up.    Follow Up Recommendations Home health PT    Equipment Recommendations  None recommended by PT    Recommendations for Other Services OT consult     Precautions / Restrictions Precautions Precautions: Knee;Fall Required Braces or Orthoses: Knee Immobilizer - Right Knee Immobilizer - Right: Discontinue once straight leg raise with < 10 degree lag Restrictions Weight Bearing Restrictions: No Other Position/Activity Restrictions: WBAT      Mobility  Bed Mobility Overal bed mobility: Needs Assistance Bed Mobility: Supine to Sit     Supine to sit: Min assist;Mod assist     General bed mobility comments: cues for sequence and use of L LE to self assist  Transfers Overall transfer level: Needs assistance Equipment used: Rolling walker (2 wheeled) Transfers: Sit to/from Stand Sit to Stand: Mod assist         General transfer comment: cues for LE management and use of UEs to self assist  Ambulation/Gait Ambulation/Gait assistance: Min assist;Mod assist Ambulation Distance (Feet): 17 Feet Assistive device: Rolling walker (2 wheeled) Gait Pattern/deviations: Step-to pattern;Decreased step length - right;Decreased step length - left;Shuffle;Trunk flexed Gait velocity: decr   General Gait Details: cues for posture, stride length, sequence and position from [employer]  Stairs            Wheelchair Mobility    Modified Rankin (Stroke Patients Only)       Balance                                             Pertinent Vitals/Pain 4/10; premed, ice pack provided     Home Living Family/patient expects to be discharged to:: Private residence Living Arrangements: Other (Comment) (Housemate "pat") Available Help at Discharge: Friend(s) Type of Home: House Home Access: Stairs to enter Entrance Stairs-Rails: Right;Left;Can reach both Secretary/administratorntrance Stairs-Number of Steps: 8+4 Home Layout: One level Home Equipment: Environmental consultantWalker - 2 wheels;Crutches      Prior Function Level of Independence: Independent               Hand Dominance   Dominant Hand: Right    Extremity/Trunk Assessment   Upper Extremity Assessment: Overall WFL for tasks assessed           Lower Extremity Assessment: RLE deficits/detail RLE Deficits / Details: 2+/5 quads  with AAROM at knee -5 - 40       Communication   Communication: HOH  Cognition Arousal/Alertness: Awake/alert Behavior During Therapy: WFL for tasks assessed/performed Overall Cognitive Status: Within Functional Limits for tasks assessed                      General Comments      Exercises Total Joint Exercises Ankle Circles/Pumps: AROM;Both;10 reps;Supine Quad Sets: AROM;Both;10 reps;Supine Heel Slides: AAROM;Right;10 reps;Supine Straight Leg Raises: AAROM;Right;10 reps;Supine      Assessment/Plan    PT Assessment Patient needs continued PT services  PT Diagnosis Difficulty walking   PT Problem List Decreased strength;Decreased range of motion;Decreased activity tolerance;Decreased mobility;Decreased  knowledge of use of DME;Obesity;Pain  PT Treatment Interventions DME instruction;Gait training;Stair training;Functional mobility training;Therapeutic activities;Therapeutic exercise;Patient/family education   PT Goals (Current goals can be found in the Care Plan section) Acute Rehab PT Goals Patient Stated Goal: Resume previous lifestyle with decreased pain PT Goal Formulation: With patient Time For Goal Achievement: 12/18/13 Potential to Achieve Goals: Good    Frequency 7X/week    Barriers to discharge        Co-evaluation               End of Session Equipment Utilized During Treatment: Gait belt;Right knee immobilizer Activity Tolerance: Patient tolerated treatment well Patient left: in chair;with nursing/sitter in room;with restraints reapplied;with call bell/phone within reach Nurse Communication: Mobility status         Time: 1010-1057 PT Time Calculation (min): 47 min   Charges:   PT Evaluation $Initial PT Evaluation Tier I: 1 Procedure PT Treatments $Gait Training: 8-22 mins $Therapeutic Exercise: 8-22 mins $Therapeutic Activity: 8-22 mins   PT G Codes:          Rateel Beldin 12/11/2013, 12:37 PM

## 2013-12-11 NOTE — Discharge Instructions (Signed)
Walk with your walker. Weight bearing as tolerated Home Health Agency will follow you at home for your therapy Do not change the dressing over the incision unless there is excess drainage. Change dressing over drain site as needed.  Shower only, no tub bath. May start showering once you return home.  Call if any temperatures greater than 101 or any wound complications: [phone] during the day and ask for Dr. Jeannetta EllisGioffre's nurse, Mackey Birchwoodammy Johnson.

## 2013-12-12 LAB — BASIC METABOLIC PANEL
Anion gap: 9 (ref 5–15)
BUN: 22 mg/dL (ref 6–23)
CALCIUM: 8.7 mg/dL (ref 8.4–10.5)
CO2: 25 mEq/L (ref 19–32)
CREATININE: 1.51 mg/dL — AB (ref 0.50–1.10)
Chloride: 108 mEq/L (ref 96–112)
GFR calc Af Amer: 39 mL/min — ABNORMAL LOW (ref >90)
GFR, EST NON AFRICAN AMERICAN: 33 mL/min — AB (ref >90)
GLUCOSE: 124 mg/dL — AB (ref 70–99)
Potassium: 4 mEq/L (ref 3.7–5.3)
Sodium: 142 mEq/L (ref 137–147)

## 2013-12-12 LAB — CBC
HEMATOCRIT: 28 % — AB (ref 36.0–46.0)
Hemoglobin: 8.9 g/dL — ABNORMAL LOW (ref 12.0–15.0)
MCH: 29.5 pg (ref 26.0–34.0)
MCHC: 31.8 g/dL (ref 30.0–36.0)
MCV: 92.7 fL (ref 78.0–100.0)
Platelets: 178 10*3/uL (ref 150–400)
RBC: 3.02 MIL/uL — ABNORMAL LOW (ref 3.87–5.11)
RDW: 13.6 % (ref 11.5–15.5)
WBC: 9.9 10*3/uL (ref 4.0–10.5)

## 2013-12-12 NOTE — Progress Notes (Signed)
Physical Therapy Treatment Patient Details Name: Maria Henry MRN: [id number] DOB: 10/24/1940 Today's Date: 12/12/2013    History of Present Illness L TKR 5/14; R TKR 12/10/13    PT Comments    Pt plans DC tomorrow after practicing steps.   Follow Up Recommendations  Home health PT     Equipment Recommendations       Recommendations for Other Services       Precautions / Restrictions Precautions Precautions: Knee;Fall Required Braces or Orthoses: Knee Immobilizer - Right    Mobility  Bed Mobility Overal bed mobility: Needs Assistance Bed Mobility: Supine to Sit;Sit to Supine     Supine to sit: Min assist Sit to supine: Min assist   General bed mobility comments: asssit for R leg  Transfers Overall transfer level: Needs assistance Equipment used: Rolling walker (2 wheeled) Transfers: Sit to/from Stand Sit to Stand: Min assist         General transfer comment: cues for LE management and use of UEs to self assist  Ambulation/Gait Ambulation/Gait assistance: Min assist Ambulation Distance (Feet): 85 Feet Assistive device: Rolling walker (2 wheeled) Gait Pattern/deviations: Step-to pattern;Step-through pattern;Antalgic Gait velocity: decr   General Gait Details: cues for posture, stride length, sequence and position from [employer]   Stairs            Wheelchair Mobility    Modified Rankin (Stroke Patients Only)       Balance                                    Cognition Arousal/Alertness: Awake/alert                          Exercises Total Joint Exercises Ankle Circles/Pumps: AROM;Both;Supine;20 reps Quad Sets: AROM;Both;Supine;20 reps Heel Slides: AAROM;Right;Supine;20 reps Hip ABduction/ADduction: AAROM;Right;10 reps;Supine Straight Leg Raises: AAROM;Right;Supine;20 reps    General Comments        Pertinent Vitals/Pain No pain, ice applied.    Home Living                      Prior Function             PT Goals (current goals can now be found in the care plan section) Progress towards PT goals: Progressing toward goals    Frequency  7X/week    PT Plan Current plan remains appropriate    Co-evaluation             End of Session Equipment Utilized During Treatment: Right knee immobilizer Activity Tolerance: Patient tolerated treatment well Patient left: in bed;with call bell/phone within reach;with family/visitor present     Time: 4098-11911402-1434 PT Time Calculation (min): 32 min  Charges:  $Gait Training: 8-22 mins $Therapeutic Exercise: 8-22 mins                    G Codes:      Rada HayHill, Maria Henry 12/12/2013, 3:59 PM

## 2013-12-12 NOTE — Evaluation (Signed)
Occupational Therapy Evaluation Patient Details Name: KYRSTAL MONTERROSA MRN: [id number] DOB: 1941-02-26 Today's Date: 12/12/2013    History of Present Illness L TKR 5/14; R TKR 12/10/13   Clinical Impression   Pt presents to OT with decreased I with ADL activity due to problems below and will benefit from skilled OT to increase I with ADL activity and return to PLOF    Follow Up Recommendations  Home health OT    Equipment Recommendations  None recommended by OT       Precautions / Restrictions Precautions Precautions: Knee;Fall Required Braces or Orthoses: Knee Immobilizer - Right Knee Immobilizer - Right: Discontinue once straight leg raise with < 10 degree lag Restrictions Weight Bearing Restrictions: No Other Position/Activity Restrictions: WBAT      Mobility Bed Mobility Overal bed mobility: Needs Assistance Bed Mobility: Sit to Supine     Supine to sit: Min assist     General bed mobility comments: pt in chair  Transfers Overall transfer level: Needs assistance Equipment used: Rolling walker (2 wheeled) Transfers: Sit to/from Stand Sit to Stand: Min assist                   ADL Overall ADL's : Needs assistance/impaired         Upper Body Bathing: Set up;Sitting   Lower Body Bathing: Moderate assistance;Sit to/from stand   Upper Body Dressing : Set up;Sitting   Lower Body Dressing: Sit to/from stand;Moderate assistance   Toilet Transfer: Event organiser Details (indicate cue type and reason): sit to stand Toileting- Clothing Manipulation and Hygiene: Sit to/from stand         General ADL Comments: Pt needs increased time to perform ADL task            Hand Dominance Right   Extremity/Trunk Assessment Upper Extremity Assessment Upper Extremity Assessment: Overall WFL for tasks assessed           Communication Communication Communication: HOH   Cognition Arousal/Alertness: Awake/alert Behavior During Therapy:  WFL for tasks assessed/performed Overall Cognitive Status: Within Functional Limits for tasks assessed                                Home Living Family/patient expects to be discharged to:: Private residence Living Arrangements: Other (Comment) (Housemate "pat") Available Help at Discharge: Friend(s) Type of Home: House Home Access: Stairs to enter Secretary/administrator of Steps: 8+4 Entrance Stairs-Rails: Right;Left;Can reach both Home Layout: One level     Bathroom Shower/Tub: Producer, television/film/video: Standard     Home Equipment: Environmental consultant - 2 wheels;Crutches          Prior Functioning/Environment Level of Independence: Independent             OT Diagnosis: Generalized weakness   OT Problem List: Decreased strength   OT Treatment/Interventions: Self-care/ADL training;Patient/family education;DME and/or AE instruction    OT Goals(Current goals can be found in the care plan section) Acute Rehab OT Goals Patient Stated Goal: Resume previous lifestyle with decreased pain OT Goal Formulation: With patient Time For Goal Achievement: 12/26/13 Potential to Achieve Goals: Good  OT Frequency: Min 2X/week   Barriers to D/C:               End of Session Equipment Utilized During Treatment: Engineer, water Communication: Mobility status  Activity Tolerance: Patient tolerated treatment well Patient left: in chair;with call bell/phone within reach;with family/visitor  present   Time: 0939-1010 OT Time Calculation (min): 31 min Charges:  OT General Charges $OT Visit: 1 Procedure OT Evaluation $Initial OT Evaluation Tier I: 1 Procedure OT Treatments $Self Care/Home Management : 23-37 mins G-Codes:    Einar CrowEDDING, Khaniyah Bezek D 12/12/2013, 10:12 AM

## 2013-12-12 NOTE — Progress Notes (Signed)
CARE MANAGEMENT NOTE 12/12/2013  Patient:  Maria Henry,Maria J   Account Number:  [id number]  Date Initiated:  12/11/2013  Documentation initiated by:  DAVIS,RHONDA  Subjective/Objective Assessment:   right total knee 07/302015     Action/Plan:   home with hhc is planned for Sunday   Anticipated DC Date:  12/13/2013   Anticipated DC Plan:  HOME W HOME HEALTH SERVICES  In-house referral  NA      DC Planning Services  CM consult      [facility]  [facility]   Choice offered to / List presented to:  C-1 Patient        HH arranged  HH-2 PT      [facility]   Status of service:   Medicare Important Message given?  YES (If response is "NO", the following Medicare IM given date fields will be blank) Date Medicare IM given:  12/12/2013 Medicare IM given by:  [facility],Kathryn Cosby Date Additional Medicare IM given:   Additional Medicare IM given by:    Discharge Disposition:  HOME W HOME HEALTH SERVICES  Per UR Regulation:  Reviewed for med. necessity/level of care/duration of stay  If discussed at Long Length of Stay Meetings, dates discussed:    Comments:  12/12/2013 1244 NCM spoke to pt and [facility]. States no DME needed for home. She has a RW at home. Isidoro DonningAlesia Demetrius Mahler RN CCM Case Mgmt phone [phone]   [phone]/Rhonda Earlene PlaterDavis, RN,BSN,CCM: [phone] Chart reviewed for patient status and needs. No discharge needs present at time of review. Next review due on 080215.

## 2013-12-12 NOTE — Progress Notes (Signed)
Subjective: 2 Days Post-Op Procedure(s) (LRB): RIGHT TOTAL KNEE ARTHROPLASTY (Right) Patient reports pain as well controlled.  Sore to right knee. Progressing with PT. Denies SOb, CP, or calf pain.  Objective: Vital signs in last 24 hours: Temp:  [98.5 F (36.9 C)-100 F (37.8 C)] 98.9 F (37.2 C) (08/01 0523) Pulse Rate:  [87-96] 88 (08/01 0523) Resp:  [14-16] 16 (08/01 0523) BP: (117-135)/(50-65) 124/52 mmHg (08/01 0523) SpO2:  [90 %-100 %] 90 % (08/01 0523)  Intake/Output from previous day: 07/31 0701 - 08/01 0700 In: 1820 [P.O.:720; I.V.:1100] Out: 1000 [Urine:1000] Intake/Output this shift:     Recent Labs  12/11/13 0445 12/12/13 0513  HGB 10.9* 8.9*    Recent Labs  12/11/13 0445 12/12/13 0513  WBC 10.1 9.9  RBC 3.51* 3.02*  HCT 32.4* 28.0*  PLT 228 178    Recent Labs  12/11/13 0445 12/12/13 0513  NA 138 142  K 4.9 4.0  CL 103 108  CO2 23 25  BUN 20 22  CREATININE 1.35* 1.51*  GLUCOSE 147* 124*  CALCIUM 9.0 8.7   No results found for this basename: LABPT, INR,  in the last 72 hours  Well nourished. Alert and oriented x3. RRR, Lungs clear, BS x4. Abdomen soft and non tender. Right Calf soft and non tender. Right knee dressing C/D/I. No DVT signs. Compartment soft. No signs of infection.  Right LE neurovascular intact.  Assessment/Plan: 2 Days Post-Op Procedure(s) (LRB): RIGHT TOTAL KNEE ARTHROPLASTY (Right) Up with PT Plan d/c tomorrow Continue current care  Acute post-op anemia: Check Hgb in am Patient asymptomatic Catherene Kaleta L 12/12/2013, 7:39 AM

## 2013-12-12 NOTE — Progress Notes (Signed)
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    PT Comments      Follow Up Recommendations  Home health PT     Equipment Recommendations  None recommended by PT    Recommendations for Other Services OT consult     Precautions / Restrictions Precautions Precautions: Knee;Fall Required Braces or Orthoses: Knee Immobilizer - Right Knee Immobilizer - Right: Discontinue once straight leg raise with < 10 degree lag Restrictions Weight Bearing Restrictions: No Other Position/Activity Restrictions: WBAT    Mobility  Bed Mobility Overal bed mobility: Needs Assistance Bed Mobility: Sit to Supine     Supine to sit: Min assist     General bed mobility comments: pt in chair  Transfers Overall transfer level: Needs assistance Equipment used: Rolling walker (2 wheeled) Transfers: Sit to/from Stand Sit to Stand: Min assist         General transfer comment: cues for LE management and use of UEs to self assist  Ambulation/Gait Ambulation/Gait assistance: Min assist Ambulation Distance (Feet): 85 Feet Assistive device: Rolling walker (2 wheeled) Gait Pattern/deviations: Step-to pattern;Decreased step length - right;Decreased step length - left;Shuffle;Trunk flexed Gait velocity: decr   General Gait Details: cues for posture, stride length, sequence and position from [employer]W   Stairs            Wheelchair Mobility    Modified Rankin (Stroke Patients Only)       Balance                                    Cognition Arousal/Alertness: Awake/alert Behavior During Therapy: WFL for tasks assessed/performed Overall Cognitive Status: Within Functional Limits for tasks assessed                      Exercises Total Joint Exercises Ankle Circles/Pumps: AROM;Both;Supine;20 reps Quad Sets: AROM;Both;Supine;20  reps Heel Slides: AAROM;Right;Supine;20 reps Straight Leg Raises: AAROM;Right;Supine;20 reps Goniometric ROM: AAROM at R knee -5 - 50    General Comments        Pertinent Vitals/Pain 4/10; premed, cold packs provided    Home Living Family/patient expects to be discharged to:: Private residence Living Arrangements: Other (Comment) (Housemate "pat") Available Help at Discharge: Friend(s) Type of Home: House Home Access: Stairs to enter Entrance Stairs-Rails: Right;Left;Can reach both Home Layout: One level Home Equipment: Environmental consultantWalker - 2 wheels;Crutches      Prior Function Level of Independence: Independent          PT Goals (current goals can now be found in the care plan section) Acute Rehab PT Goals Patient Stated Goal: Resume previous lifestyle with decreased pain PT Goal Formulation: With patient Time For Goal Achievement: 12/18/13 Potential to Achieve Goals: Good Progress towards PT goals: Progressing toward goals    Frequency  7X/week    PT Plan Current plan remains appropriate    Co-evaluation             End of Session Equipment Utilized During Treatment: Gait belt;Right knee immobilizer Activity Tolerance: Patient tolerated treatment well Patient left: in chair;with call bell/phone within reach     Time: 0800-0840 PT Time Calculation (min): 40 min  Charges:  $Gait Training: 23-37 mins $Therapeutic Exercise: 8-22 mins  G Codes:      Trachelle Low December 19, 2013, 12:05 PM

## 2013-12-12 NOTE — Plan of Care (Signed)
Problem: Phase III Progression Outcomes Goal: Anticoagulant follow-up in place Outcome: Not Applicable Date Met:  42/71/56 xarelto

## 2013-12-13 LAB — CBC
HCT: 24.8 % — ABNORMAL LOW (ref 36.0–46.0)
Hemoglobin: 8.2 g/dL — ABNORMAL LOW (ref 12.0–15.0)
MCH: 30.8 pg (ref 26.0–34.0)
MCHC: 33.1 g/dL (ref 30.0–36.0)
MCV: 93.2 fL (ref 78.0–100.0)
PLATELETS: 153 10*3/uL (ref 150–400)
RBC: 2.66 MIL/uL — ABNORMAL LOW (ref 3.87–5.11)
RDW: 13.9 % (ref 11.5–15.5)
WBC: 9.1 10*3/uL (ref 4.0–10.5)

## 2013-12-13 NOTE — Progress Notes (Signed)
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  Follow Up Recommendations  Home health PT     Equipment Recommendations       Recommendations for Other Services       Precautions / Restrictions Precautions Precautions: Knee;Fall Required Braces or Orthoses: Knee Immobilizer - Right Knee Immobilizer - Right: Discontinue once straight leg raise with < 10 degree lag Restrictions Other Position/Activity Restrictions: WBAT    Mobility  Bed Mobility Overal bed mobility: Needs Assistance Bed Mobility: Supine to Sit     Supine to sit: Min assist;HOB elevated     General bed mobility comments: assist for R LE  Transfers Overall transfer level: Needs assistance Equipment used: Rolling walker (2 wheeled) Transfers: Sit to/from Stand Sit to Stand: Supervision         General transfer comment: verbal cues for hand placement and to kick R LE out  Ambulation/Gait Ambulation/Gait assistance: Supervision Ambulation Distance (Feet): 5 Feet Assistive device: Rolling walker (2 wheeled) Gait Pattern/deviations: Step-through pattern;Antalgic     General Gait Details: cues for posture, stride length, sequence and position from RW   Stairs Stairs: Yes Stairs assistance: Min guard Stair Management: Two rails;Step to pattern;Forwards Number of Stairs: 2 General stair comments: pt reports she likes to use crutches, VC for sequence and safety  Wheelchair Mobility    Modified Rankin (Stroke Patients Only)       Balance                                    Cognition Arousal/Alertness: Awake/alert Behavior During Therapy: WFL for tasks assessed/performed Overall Cognitive Status: Within Functional Limits for tasks assessed                      Exercises Total Joint  Exercises Ankle Circles/Pumps: AROM;Both;Supine;20 reps Quad Sets: AROM;Both;Supine;20 reps Short Arc Quad: AROM;Right;5 reps;Seated Heel Slides: AAROM;Right;Supine;20 reps Hip ABduction/ADduction: AAROM;Right;10 reps;Supine Straight Leg Raises: AAROM;Right;Supine;20 reps Goniometric ROM: 5-50    General Comments        Pertinent Vitals/Pain none    Home Living                      Prior Function            PT Goals (current goals can now be found in the care plan section) Acute Rehab PT Goals Patient Stated Goal: Resume previous lifestyle with decreased pain Progress towards PT goals: Progressing toward goals    Frequency       PT Plan Current plan remains appropriate    Co-evaluation             End of Session Equipment Utilized During Treatment: Right knee immobilizer Activity Tolerance: Patient tolerated treatment well Patient left: in chair;with call bell/phone within reach     Time: 1013-1042 PT Time Calculation (min): 29 min  Charges:  $Gait Training: 8-22 mins $Therapeutic Exercise: 8-22 mins                    G Codes:      Rada HayHill, Maria Henry 12/13/2013, 11:04 AM

## 2013-12-13 NOTE — Progress Notes (Signed)
Subjective: 3 Days Post-Op Procedure(s) (LRB): RIGHT TOTAL KNEE ARTHROPLASTY (Right) Patient reports pain as 2 on 0-10 scale. Doing well today and ready for DC.HBg 8.2 and stable.  Objective: Vital signs in last 24 hours: Temp:  [97.9 F (36.6 C)-99.6 F (37.6 C)] 97.9 F (36.6 C) (08/02 0512) Pulse Rate:  [74-88] 74 (08/02 0512) Resp:  [16-18] 18 (08/02 0512) BP: (111-117)/(46-70) 112/70 mmHg (08/02 0512) SpO2:  [92 %-97 %] 92 % (08/02 0512)  Intake/Output from previous day: 08/01 0701 - 08/02 0700 In: 2420 [P.O.:720; I.V.:1700] Out: 0  Intake/Output this shift:     Recent Labs  12/11/13 0445 12/12/13 0513 12/13/13 0543  HGB 10.9* 8.9* 8.2*    Recent Labs  12/12/13 0513 12/13/13 0543  WBC 9.9 9.1  RBC 3.02* 2.66*  HCT 28.0* 24.8*  PLT 178 153    Recent Labs  12/11/13 0445 12/12/13 0513  NA 138 142  K 4.9 4.0  CL 103 108  CO2 23 25  BUN 20 22  CREATININE 1.35* 1.51*  GLUCOSE 147* 124*  CALCIUM 9.0 8.7   No results found for this basename: LABPT, INR,  in the last 72 hours  No cellulitis present  Assessment/Plan: 3 Days Post-Op Procedure(s) (LRB): RIGHT TOTAL KNEE ARTHROPLASTY (Right) Discharge home with home health  Yovana Scogin A 12/13/2013, 8:27 AM

## 2013-12-13 NOTE — Progress Notes (Signed)
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  She is knowledgeable in use and availability of AE for LB bathing and dressing. Pt plans to discharge today with assist of friends.  Follow Up Recommendations  Home health OT    Equipment Recommendations  None recommended by OT    Recommendations for Other Services      Precautions / Restrictions Precautions Precautions: Knee;Fall Required Braces or Orthoses: Knee Immobilizer - Right Knee Immobilizer - Right: Discontinue once straight leg raise with < 10 degree lag Restrictions Other Position/Activity Restrictions: WBAT       Mobility Bed Mobility Overal bed mobility: Needs Assistance Bed Mobility: Supine to Sit     Supine to sit: Min assist;HOB elevated     General bed mobility comments: assist for R LE  Transfers   Equipment used: Rolling walker (2 wheeled) Transfers: Sit to/from Stand Sit to Stand: Supervision         General transfer comment: verbal cues for hand placement and to kick R LE out    Balance                                   ADL Overall ADL's : Needs assistance/impaired     Grooming: Wash/dry hands;Wash/dry face;Standing;Supervision/safety               Lower Body Dressing: Supervision/safety;With adaptive equipment;Sit to/from stand   Toilet Transfer: Supervision/safety;Ambulation;BSC;RW (over toilet)   Toileting- Clothing Manipulation and Hygiene: Supervision/safety;Sit to/from stand       Functional mobility during ADLs: Supervision/safety;Rolling walker General ADL Comments: Instructed in use of AE for LB ADL, to dress R LE first, safe footwear, and transport of items with RW.      Vision                      Perception     Praxis      Cognition   Behavior During Therapy: WFL for tasks assessed/performed Overall Cognitive Status: Within Functional Limits for tasks assessed                       Extremity/Trunk Assessment               Exercises     Shoulder Instructions       General Comments      Pertinent Vitals/ Pain       Premedicated, did not c/o pain  Home Living                                          Prior Functioning/Environment              Frequency       Progress Toward Goals  OT Goals(current goals can now be found in the care plan section)  Progress towards OT goals: Goals met/education completed, patient discharged from OT  Acute Rehab OT Goals Patient Stated Goal: Resume previous lifestyle with decreased pain  Plan Discharge plan remains appropriate    Co-evaluation  End of Session Equipment Utilized During Treatment: Rolling walker   Activity Tolerance Patient tolerated treatment well   Patient Left in chair;with call bell/phone within reach   Nurse Communication          Time: [phone] OT Time Calculation (min): 35 min  Charges: OT General Charges $OT Visit: 1 Procedure OT Treatments $Self Care/Home Management : 23-37 mins  Malka So 12/13/2013, 10:00 AM [phone]

## 2013-12-13 NOTE — Progress Notes (Signed)
Patient has orders to discharge home.  Discharge instructions reviewed with patient and family caregivers, both verbalized understanding.  Prescriptions given to family members.  Patient escorted from floor via w/c by NT and family member.  Dorothyann PengKim Raheel Kunkle RN

## 2013-12-14 NOTE — Discharge Summary (Signed)
Physician Discharge Summary   Patient ID: Maria Henry MRN: [id number] DOB/AGE: 72-Jan-1942 72 y.o.  Admit date: 12/10/2013 Discharge date: 12/13/2013  Primary Diagnosis: Osteoarthritis, right knee  Admission Diagnoses:  Past Medical History  Diagnosis Date  . Depression   . Hypothyroidism   . GERD (gastroesophageal reflux disease)   . Impaired memory     MILD   . Stroke 4097,3532    "in right eye"--PT STATES HER VISION IS OK IN RIGHT EYE  . Sleep apnea     PT HAS SLEEP STUDY ABOUT 3 MONTHS AGO - ABOUT APRIL 2015 - TOLD YOU HAVE SLEEP APNEA  - BUT HAS NOT HAD 2ND PART OF THE STUDY TO BE FITTED FOR CPAP  . Headache(784.0)     hx of MIGRAINES  . Arthritis     OA AND PAIN RT KNEE, ARTHRITIS BOTH HANDS    Discharge Diagnoses:   Active Problems:   Osteoarthritis of right knee   H/O total knee replacement  Estimated body mass index is 33.59 kg/(m^2) as calculated from the following:   Height as of this encounter: 5' (1.524 m).   Weight as of this encounter: 78.019 kg (172 lb).  Procedure:  Procedure(s) (LRB): RIGHT TOTAL KNEE ARTHROPLASTY (Right)   Consults: None  HPI: Maria Henry, 73 y.o. female, has a history of pain and functional disability in the right knee due to arthritis and has failed non-surgical conservative treatments for greater than 12 weeks to includeNSAID's and/or analgesics, corticosteriod injections, viscosupplementation injections and activity modification. Onset of symptoms was gradual, starting >10 years ago with gradually worsening course since that time. The patient noted prior procedures on the knee to include arthroscopy and menisectomy on the right knee(s). Patient currently rates pain in the right knee(s) at 8 out of 10 with activity. Patient has night pain, worsening of pain with activity and weight bearing, pain that interferes with activities of daily living, pain with passive range of motion, crepitus and joint swelling. Patient has evidence of  periarticular osteophytes and joint space narrowing by imaging studies. There is no active infection.     Laboratory Data: Admission on 12/10/2013, Discharged on 12/13/2013  Component Date Value Ref Range Status  . WBC 12/11/2013 10.1  4.0 - 10.5 K/uL Final  . RBC 12/11/2013 3.51* 3.87 - 5.11 MIL/uL Final  . Hemoglobin 12/11/2013 10.9* 12.0 - 15.0 g/dL Final  . HCT 12/11/2013 32.4* 36.0 - 46.0 % Final  . MCV 12/11/2013 92.3  78.0 - 100.0 fL Final  . MCH 12/11/2013 31.1  26.0 - 34.0 pg Final  . MCHC 12/11/2013 33.6  30.0 - 36.0 g/dL Final  . RDW 12/11/2013 13.1  11.5 - 15.5 % Final  . Platelets 12/11/2013 228  150 - 400 K/uL Final  . Sodium 12/11/2013 138  137 - 147 mEq/L Final  . Potassium 12/11/2013 4.9  3.7 - 5.3 mEq/L Final   Comment: SLIGHT HEMOLYSIS                          HEMOLYSIS AT THIS LEVEL MAY AFFECT RESULT  . Chloride 12/11/2013 103  96 - 112 mEq/L Final  . CO2 12/11/2013 23  19 - 32 mEq/L Final  . Glucose, Bld 12/11/2013 147* 70 - 99 mg/dL Final  . BUN 12/11/2013 20  6 - 23 mg/dL Final  . Creatinine, Ser 12/11/2013 1.35* 0.50 - 1.10 mg/dL Final  . Calcium 12/11/2013 9.0  8.4 - 10.5 mg/dL Final  .  GFR calc non Af Amer 12/11/2013 38* >90 mL/min Final  . GFR calc Af Amer 12/11/2013 44* >90 mL/min Final   Comment: (NOTE)                          The eGFR has been calculated using the CKD EPI equation.                          This calculation has not been validated in all clinical situations.                          eGFR's persistently <90 mL/min signify possible Chronic Kidney                          Disease.  . Anion gap 12/11/2013 12  5 - 15 Final  . WBC 12/12/2013 9.9  4.0 - 10.5 K/uL Final  . RBC 12/12/2013 3.02* 3.87 - 5.11 MIL/uL Final  . Hemoglobin 12/12/2013 8.9* 12.0 - 15.0 g/dL Final   Comment: DELTA CHECK NOTED                          REPEATED TO VERIFY  . HCT 12/12/2013 28.0* 36.0 - 46.0 % Final  . MCV 12/12/2013 92.7  78.0 - 100.0 fL Final  . MCH  12/12/2013 29.5  26.0 - 34.0 pg Final  . MCHC 12/12/2013 31.8  30.0 - 36.0 g/dL Final  . RDW 12/12/2013 13.6  11.5 - 15.5 % Final  . Platelets 12/12/2013 178  150 - 400 K/uL Final  . Sodium 12/12/2013 142  137 - 147 mEq/L Final  . Potassium 12/12/2013 4.0  3.7 - 5.3 mEq/L Final   Comment: REPEATED TO VERIFY                          DELTA CHECK NOTED  . Chloride 12/12/2013 108  96 - 112 mEq/L Final  . CO2 12/12/2013 25  19 - 32 mEq/L Final  . Glucose, Bld 12/12/2013 124* 70 - 99 mg/dL Final  . BUN 12/12/2013 22  6 - 23 mg/dL Final  . Creatinine, Ser 12/12/2013 1.51* 0.50 - 1.10 mg/dL Final  . Calcium 12/12/2013 8.7  8.4 - 10.5 mg/dL Final  . GFR calc non Af Amer 12/12/2013 33* >90 mL/min Final  . GFR calc Af Amer 12/12/2013 39* >90 mL/min Final   Comment: (NOTE)                          The eGFR has been calculated using the CKD EPI equation.                          This calculation has not been validated in all clinical situations.                          eGFR's persistently <90 mL/min signify possible Chronic Kidney                          Disease.  . Anion gap 12/12/2013 9  5 - 15 Final  . WBC 12/13/2013 9.1  4.0 - 10.5 K/uL Final  . RBC 12/13/2013  2.66* 3.87 - 5.11 MIL/uL Final  . Hemoglobin 12/13/2013 8.2* 12.0 - 15.0 g/dL Final  . HCT 12/13/2013 24.8* 36.0 - 46.0 % Final  . MCV 12/13/2013 93.2  78.0 - 100.0 fL Final  . MCH 12/13/2013 30.8  26.0 - 34.0 pg Final  . MCHC 12/13/2013 33.1  30.0 - 36.0 g/dL Final  . RDW 12/13/2013 13.9  11.5 - 15.5 % Final  . Platelets 12/13/2013 153  150 - 400 K/uL Final  Hospital Outpatient Visit on 12/04/2013  Component Date Value Ref Range Status  . MRSA, PCR 12/04/2013 NEGATIVE  NEGATIVE Final  . Staphylococcus aureus 12/04/2013 NEGATIVE  NEGATIVE Final   Comment:                                 The Xpert SA Assay (FDA                          approved for NASAL specimens                          in patients over 12 years of age),                           is one component of                          a comprehensive surveillance                          program.  Test performance has                          been validated by [employer] for patients greater                          than or equal to 49 year old.                          It is not intended                          to diagnose infection nor to                          guide or monitor treatment.  . WBC 12/04/2013 4.5  4.0 - 10.5 K/uL Final  . RBC 12/04/2013 4.07  3.87 - 5.11 MIL/uL Final  . Hemoglobin 12/04/2013 12.4  12.0 - 15.0 g/dL Final  . HCT 12/04/2013 37.5  36.0 - 46.0 % Final  . MCV 12/04/2013 92.1  78.0 - 100.0 fL Final  . MCH 12/04/2013 30.5  26.0 - 34.0 pg Final  . MCHC 12/04/2013 33.1  30.0 - 36.0 g/dL Final  . RDW 12/04/2013 13.3  11.5 - 15.5 % Final  . Platelets 12/04/2013 286  150 - 400 K/uL Final  . aPTT 12/04/2013 35  24 - 37 seconds Final  . Sodium 12/04/2013 142  137 - 147 mEq/L Final  .  Potassium 12/04/2013 4.4  3.7 - 5.3 mEq/L Final  . Chloride 12/04/2013 106  96 - 112 mEq/L Final  . CO2 12/04/2013 25  19 - 32 mEq/L Final  . Glucose, Bld 12/04/2013 111* 70 - 99 mg/dL Final  . BUN 12/04/2013 14  6 - 23 mg/dL Final  . Creatinine, Ser 12/04/2013 1.25* 0.50 - 1.10 mg/dL Final  . Calcium 12/04/2013 10.0  8.4 - 10.5 mg/dL Final  . Total Protein 12/04/2013 7.5  6.0 - 8.3 g/dL Final  . Albumin 12/04/2013 3.9  3.5 - 5.2 g/dL Final  . AST 12/04/2013 31  0 - 37 U/L Final   Comment: SLIGHT HEMOLYSIS                          HEMOLYSIS AT THIS LEVEL MAY AFFECT RESULT  . ALT 12/04/2013 18  0 - 35 U/L Final  . Alkaline Phosphatase 12/04/2013 86  39 - 117 U/L Final  . Total Bilirubin 12/04/2013 0.4  0.3 - 1.2 mg/dL Final  . GFR calc non Af Amer 12/04/2013 42* >90 mL/min Final  . GFR calc Af Amer 12/04/2013 49* >90 mL/min Final   Comment: (NOTE)                          The eGFR has been calculated using the CKD EPI  equation.                          This calculation has not been validated in all clinical situations.                          eGFR's persistently <90 mL/min signify possible Chronic Kidney                          Disease.  . Anion gap 12/04/2013 11  5 - 15 Final  . Prothrombin Time 12/04/2013 12.7  11.6 - 15.2 seconds Final  . INR 12/04/2013 0.95  0.00 - 1.49 Final  . ABO/RH(D) 12/04/2013 O POS   Final  . Antibody Screen 12/04/2013 NEG   Final  . Sample Expiration 12/04/2013 12/13/2013   Final  . Color, Urine 12/04/2013 YELLOW  YELLOW Final  . APPearance 12/04/2013 CLOUDY* CLEAR Final  . Specific Gravity, Urine 12/04/2013 1.021  1.005 - 1.030 Final  . pH 12/04/2013 6.0  5.0 - 8.0 Final  . Glucose, UA 12/04/2013 NEGATIVE  NEGATIVE mg/dL Final  . Hgb urine dipstick 12/04/2013 NEGATIVE  NEGATIVE Final  . Bilirubin Urine 12/04/2013 NEGATIVE  NEGATIVE Final  . Ketones, ur 12/04/2013 NEGATIVE  NEGATIVE mg/dL Final  . Protein, ur 12/04/2013 NEGATIVE  NEGATIVE mg/dL Final  . Urobilinogen, UA 12/04/2013 0.2  0.0 - 1.0 mg/dL Final  . Nitrite 12/04/2013 NEGATIVE  NEGATIVE Final  . Leukocytes, UA 12/04/2013 LARGE* NEGATIVE Final  . Squamous Epithelial / LPF 12/04/2013 MANY* RARE Final  . WBC, UA 12/04/2013 TOO NUMEROUS TO COUNT  <3 WBC/hpf Final  . RBC / HPF 12/04/2013 0-2  <3 RBC/hpf Final  . Bacteria, UA 12/04/2013 MANY* RARE Final     X-Rays:Dg Chest 2 View  12/04/2013   CLINICAL DATA:  Preoperative study  EXAM: CHEST  2 VIEW  COMPARISON:  PA and lateral chest x-ray of June 15, 2013.  FINDINGS: The lungs are borderline hyperinflated with hemidiaphragm flattening.  The interstitial markings are mildly prominent though stable. The heart and pulmonary vascularity are normal. There is no pleural effusion. The observed bony structures are unremarkable.  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 There is no pneumonia nor  CHF.   Electronically Signed   By: David  Martinique   On: 12/04/2013 12:06    EKG: Orders placed in visit on 12/04/13  . EKG 12-LEAD     Hospital Course: MADASYN HEATH is a 73 y.o. who was admitted to [facility]. They were brought to the operating room on 12/10/2013 and underwent Procedure(s): RIGHT TOTAL KNEE ARTHROPLASTY.  Patient tolerated the procedure well and was later transferred to the recovery room and then to the orthopaedic floor for postoperative care.  They were given PO and IV analgesics for pain control following their surgery.  They were given 24 hours of postoperative antibiotics of  Anti-infectives   Start     Dose/Rate Route Frequency Ordered Stop   12/10/13 1900  ceFAZolin (ANCEF) IVPB 2 g/50 mL premix     2 g 100 mL/hr over 30 Minutes Intravenous Every 6 hours 12/10/13 1853 12/11/13 0301   12/10/13 1332  polymyxin B 500,000 Units, bacitracin 50,000 Units in sodium chloride irrigation 0.9 % 500 mL irrigation  Status:  Discontinued       As needed 12/10/13 1333 12/10/13 1520   12/10/13 1138  ceFAZolin (ANCEF) IVPB 2 g/50 mL premix     2 g 100 mL/hr over 30 Minutes Intravenous On call to O.R. 12/10/13 1138 12/10/13 1300     and started on DVT prophylaxis in the form of Xarelto.   PT and OT were ordered for total joint protocol.  Discharge planning consulted to help with postop disposition and equipment needs.  Patient had a fair night on the evening of surgery.  They started to get up OOB with therapy on day one. Hemovac drain was pulled without difficulty.  Continued to work with therapy into day two.  Dressing was changed on day two and the incision was clean and dry.  By day three, the patient had progressed with therapy and meeting their goals.  Incision was healing well.  Patient was seen in rounds and was ready to go home.   Diet: Regular diet Activity:WBAT Follow-up:in 2 weeks Disposition - Home Discharged Condition: stable   Discharge Instructions    Call MD / Call 911    Complete by:  As directed   If you experience chest pain or shortness of breath, CALL 911 and be transported to the hospital emergency room.  If you develope a fever above 101 F, pus (white drainage) or increased drainage or redness at the wound, or calf pain, call your surgeon's office.     Constipation Prevention    Complete by:  As directed   Drink plenty of fluids.  Prune juice may be helpful.  You may use a stool softener, such as Colace (over the counter) 100 mg twice a day.  Use MiraLax (over the counter) for constipation as needed.     Diet - low sodium heart healthy    Complete by:  As directed      Discharge instructions    Complete by:  As directed   Walk with your walker. Weight bearing as tolerated [facility] will follow you at home for your therapy Do not change the dressing over the incision unless there is excess drainage. Change dressing  over drain site as needed.  Shower only, no tub bath. May start showering once you return home.  Call if any temperatures greater than 101 or any wound complications: [id number] during the day and ask for Dr. Charlestine Night nurse, Brunilda Payor.     Do not put a pillow under the knee. Place it under the heel.    Complete by:  As directed      Driving restrictions    Complete by:  As directed   No driving     Increase activity slowly as tolerated    Complete by:  As directed             Medication List    STOP taking these medications       CALCIUM 600 + D PO     multivitamin with minerals Tabs tablet     PLAVIX 75 MG tablet  Generic drug:  clopidogrel      TAKE these medications       acetaminophen 650 MG CR tablet  Commonly known as:  TYLENOL  Take 650 mg by mouth every 8 (eight) hours as needed for pain.     desonide 0.05 % cream  Commonly known as:  DESOWEN  Apply 1 application topically 2 (two) times daily as needed.     DSS 100 MG Caps  Take 100 mg by mouth 2 (two) times daily.      ferrous sulfate 325 (65 FE) MG tablet  Take 1 tablet (325 mg total) by mouth 3 (three) times daily after meals.     fluocinonide 0.05 % external solution  Commonly known as:  LIDEX  Apply 1 application topically 2 (two) times daily as needed (itching).     ketoconazole 2 % cream  Commonly known as:  NIZORAL  Apply 1 application topically daily as needed for irritation.     ketoconazole 2 % shampoo  Commonly known as:  NIZORAL  Apply topically 2 (two) times a week.     levothyroxine 88 MCG tablet  Commonly known as:  SYNTHROID, LEVOTHROID  Take 88 mcg by mouth daily before breakfast.     LORazepam 1 MG tablet  Commonly known as:  ATIVAN  Take 2 mg by mouth at bedtime as needed for anxiety (sleep). For sleep     methocarbamol 500 MG tablet  Commonly known as:  ROBAXIN  Take 1 tablet (500 mg total) by mouth every 6 (six) hours as needed for muscle spasms.     naftifine 1 % cream  Commonly known as:  NAFTIN  Apply 1 application topically daily as needed (for rash).     omeprazole 20 MG tablet  Commonly known as:  PRILOSEC OTC  Take 20 mg by mouth daily. TAKES IN AM     PARoxetine 30 MG tablet  Commonly known as:  PAXIL  Take 30 mg by mouth every morning.     rivaroxaban 10 MG Tabs tablet  Commonly known as:  XARELTO  Take 1 tablet (10 mg total) by mouth daily with breakfast.     rosuvastatin 40 MG tablet  Commonly known as:  CRESTOR  Take 40 mg by mouth daily. EVERY AM     traMADol 50 MG tablet  Commonly known as:  ULTRAM  Take 1-2 tablets (50-100 mg total) by mouth every 6 (six) hours as needed for moderate pain.     trospium 20 MG tablet  Commonly known as:  SANCTURA  Take 20 mg by mouth 2 (two) times  daily.           Follow-up Information   Follow up with GIOFFRE,RONALD A, MD. Schedule an appointment as soon as possible for a visit in 2 weeks.   Specialty:  Orthopedic Surgery   Contact information:   [address] [place]  [address] [phone]       Follow up with [facility]. [facility])    Contact information:   [place] [address] [phone]       Signed: Ardeen Jourdain, PA-C Orthopaedic Surgery 12/14/2013, 8:58 AM

## 2014-07-26 IMAGING — CR DG KNEE 1-2V*L*
1 series · 2 of 2 positions shown · non-contrast
Comparison: None.

CLINICAL DATA: Postop left knee.

LEFT KNEE - 1-2 VIEW

[Series 1: AP · left · 2 of 2 slices shown]
[im 1/2]
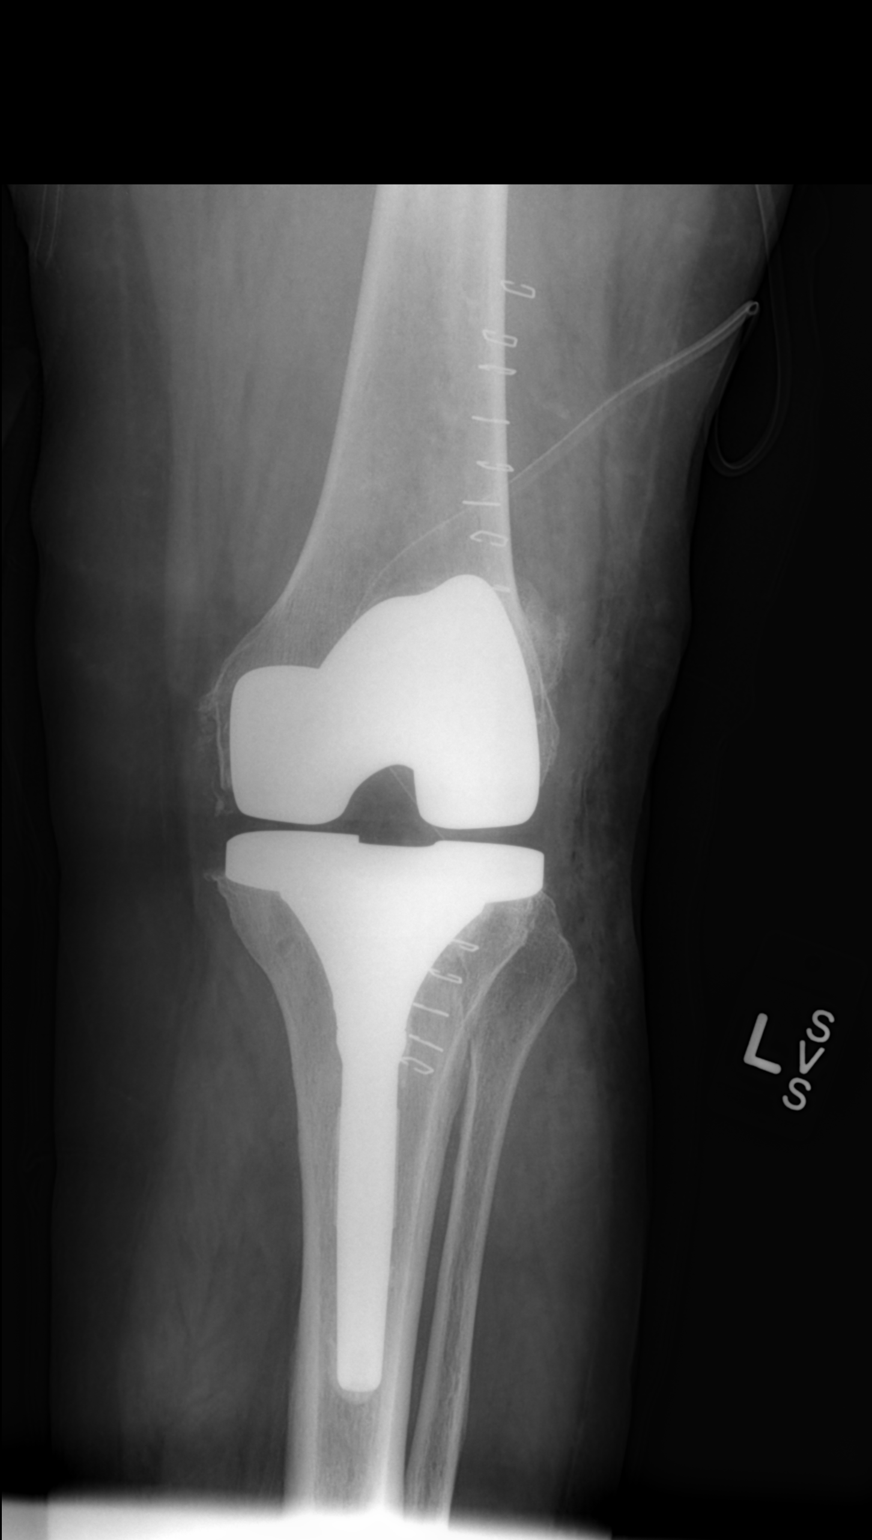
[im 2/2]
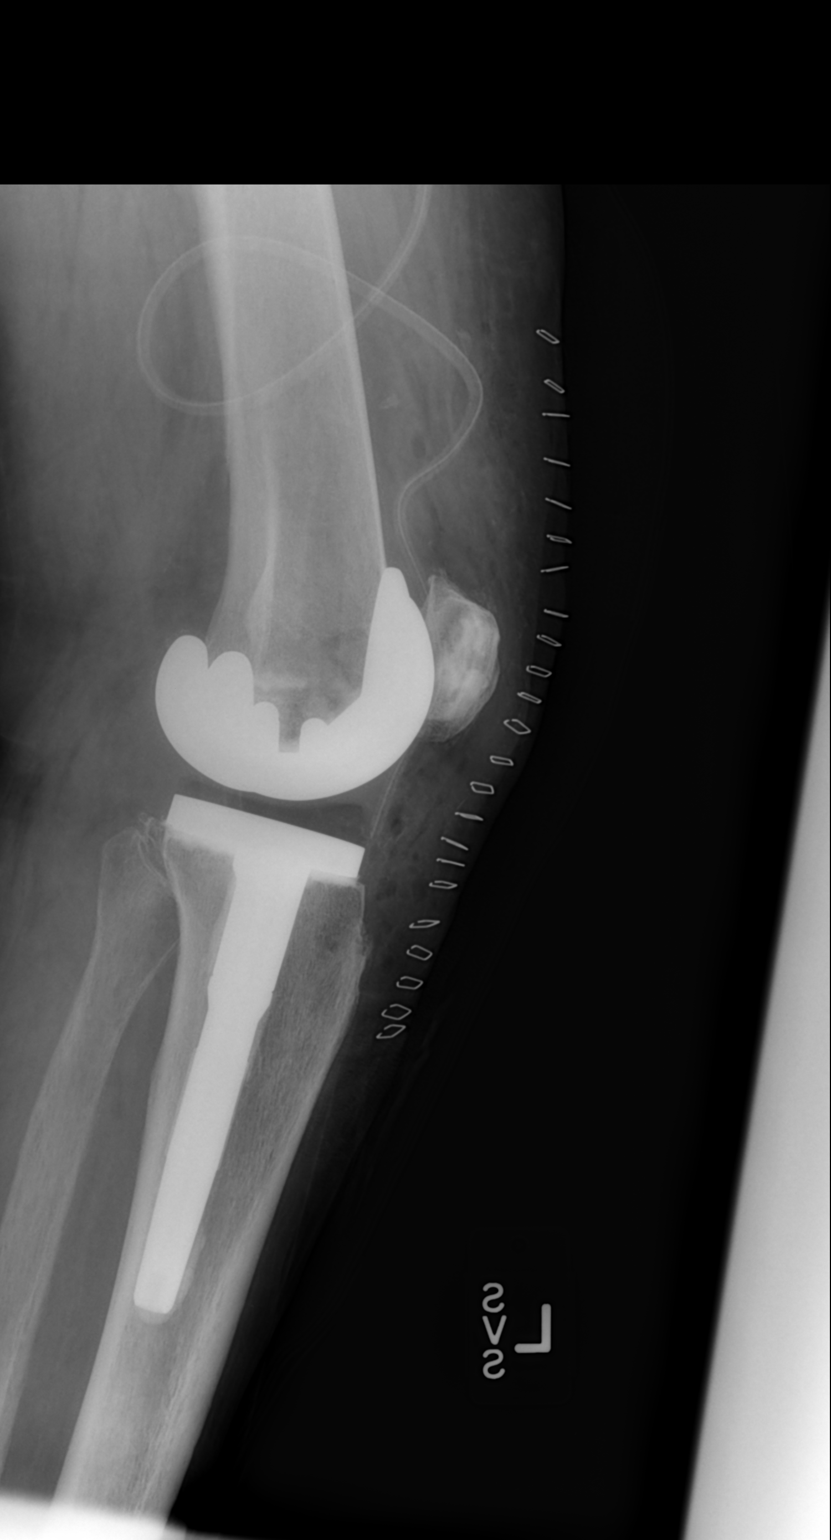

[2 of 2 positions shown; findings below may reference images not displayed]

FINDINGS: Post total left knee replacement which appears in
satisfactory position without complication noted.  Surgical drain
is in place.
IMPRESSION: Post total left knee replacement which appears in satisfactory
position without complication noted.

## 2015-01-15 ENCOUNTER — Emergency Department (HOSPITAL_BASED_OUTPATIENT_CLINIC_OR_DEPARTMENT_OTHER)
Admission: EM | Admit: 2015-01-15 | Discharge: 2015-01-15 | Disposition: A | Discharge status: Home / Self Care | Payer: Medicare PPO | Attending: Emergency Medicine | Admitting: Emergency Medicine

## 2015-01-15 ENCOUNTER — Emergency Department (HOSPITAL_BASED_OUTPATIENT_CLINIC_OR_DEPARTMENT_OTHER): Payer: Medicare PPO

## 2015-01-15 ENCOUNTER — Encounter (HOSPITAL_BASED_OUTPATIENT_CLINIC_OR_DEPARTMENT_OTHER): Payer: Self-pay | Admitting: *Deleted

## 2015-01-15 DIAGNOSIS — E039 Hypothyroidism, unspecified: Secondary | ICD-10-CM | POA: Diagnosis not present

## 2015-01-15 DIAGNOSIS — Y998 Other external cause status: Secondary | ICD-10-CM | POA: Diagnosis not present

## 2015-01-15 DIAGNOSIS — Z8679 Personal history of other diseases of the circulatory system: Secondary | ICD-10-CM | POA: Diagnosis not present

## 2015-01-15 DIAGNOSIS — S2231XA Fracture of one rib, right side, initial encounter for closed fracture: Secondary | ICD-10-CM | POA: Diagnosis not present

## 2015-01-15 DIAGNOSIS — W010XXA Fall on same level from slipping, tripping and stumbling without subsequent striking against object, initial encounter: Secondary | ICD-10-CM | POA: Diagnosis not present

## 2015-01-15 DIAGNOSIS — K219 Gastro-esophageal reflux disease without esophagitis: Secondary | ICD-10-CM | POA: Diagnosis not present

## 2015-01-15 DIAGNOSIS — Y92000 Kitchen of unspecified non-institutional (private) residence as  the place of occurrence of the external cause: Secondary | ICD-10-CM | POA: Insufficient documentation

## 2015-01-15 DIAGNOSIS — M1990 Unspecified osteoarthritis, unspecified site: Secondary | ICD-10-CM | POA: Insufficient documentation

## 2015-01-15 DIAGNOSIS — F329 Major depressive disorder, single episode, unspecified: Secondary | ICD-10-CM | POA: Insufficient documentation

## 2015-01-15 DIAGNOSIS — Y9389 Activity, other specified: Secondary | ICD-10-CM | POA: Diagnosis not present

## 2015-01-15 DIAGNOSIS — Z79899 Other long term (current) drug therapy: Secondary | ICD-10-CM | POA: Diagnosis not present

## 2015-01-15 DIAGNOSIS — Z8669 Personal history of other diseases of the nervous system and sense organs: Secondary | ICD-10-CM | POA: Insufficient documentation

## 2015-01-15 DIAGNOSIS — S299XXA Unspecified injury of thorax, initial encounter: Secondary | ICD-10-CM | POA: Diagnosis present

## 2015-01-15 DIAGNOSIS — Z8673 Personal history of transient ischemic attack (TIA), and cerebral infarction without residual deficits: Secondary | ICD-10-CM | POA: Diagnosis not present

## 2015-01-15 LAB — CBG MONITORING, ED: GLUCOSE-CAPILLARY: 97 mg/dL (ref 65–99)

## 2015-01-15 MED ORDER — IBUPROFEN 800 MG PO TABS
800.0000 mg | ORAL_TABLET | Freq: Once | ORAL | Status: AC
  Administered 2015-01-15: 800 mg via ORAL
  Filled 2015-01-15: qty 1

## 2015-01-15 MED ORDER — IBUPROFEN 800 MG PO TABS: 800.0000 mg | ORAL_TABLET | Freq: Three times a day (TID) | ORAL | Status: DC

## 2015-01-15 NOTE — ED Provider Notes (Signed)
CSN: [id number]     Arrival date & time 01/15/15  1437 History  This chart was scribed for Maria Hong, MD by Doreatha Martin, ED Scribe. This patient was seen in room MH03/MH03 and the patient's care was started at 3:12 PM.     Chief Complaint  Patient presents with  . Fall   The history is provided by the patient and a relative. No language interpreter was used.    HPI Comments: Maria Henry is a 74 y.o. female who presents to the Emergency Department complaining of an unwitnessed fall that occurred at 2300 last night. Per daughter, she saw the pt stumbling in the kitchen before the fall and believes that she hit her head on the left temple. Daughter states that she heard the fall but did not witness it. Pt states associated moderate, sharp right lower costal pain. Pt states that she has a Hx of falls without remembering the incident. Pt is on anticoagulants. She denies HA, abdominal pain, BLE pain, visual disturbance, SOB.   Past Medical History  Diagnosis Date  . Depression   . Hypothyroidism   . GERD (gastroesophageal reflux disease)   . Impaired memory     MILD   . Stroke 7829,5621    "in right eye"--PT STATES HER VISION IS OK IN RIGHT EYE  . Sleep apnea     PT HAS SLEEP STUDY ABOUT 3 MONTHS AGO - ABOUT APRIL 2015 - TOLD YOU HAVE SLEEP APNEA  - BUT HAS NOT HAD 2ND PART OF THE STUDY TO BE FITTED FOR CPAP  . Headache(784.0)     hx of MIGRAINES  . Arthritis     OA AND PAIN RT KNEE, ARTHRITIS BOTH HANDS    Past Surgical History  Procedure Laterality Date  . Carpal tunnel release Bilateral   . Rotator cuff repair Bilateral     x2 left, x1 right  . Foot surgery Right   . Toe surgery Right 2011    "tried to straighten"  . Back surgery      lower back  . Knee arthroscopy Right 07/01/2012    Procedure: RIGHT KNEE ARTHROSCOPY WITH PARTIAL MEDIAL/LATERAL MENISECTOMY;  Surgeon: Drucilla Schmidt, MD;  Location: [facility];  Service: Orthopedics;  Laterality: Right;  RIGHT KNEE ARTHROSCOPY  WITH PARTIAL MEDIAL/LATERAL MENISECTOMY  . Abdominal hysterectomy  1994  . Total knee arthroplasty Left 09/16/2012    Procedure: LEFT TOTAL KNEE ARTHROPLASTY;  Surgeon: Drucilla Schmidt, MD;  Location: [facility];  Service: Orthopedics;  Laterality: Left;  . Total knee arthroplasty Right 12/10/2013    Procedure: RIGHT TOTAL KNEE ARTHROPLASTY;  Surgeon: Jacki Cones, MD;  Location: [facility];  Service: Orthopedics;  Laterality: Right;   History reviewed. No pertinent family history. Social History  Substance Use Topics  . Smoking status: Never Smoker   . Smokeless tobacco: Never Used  . Alcohol Use: No   OB History    No data available     Review of Systems  Eyes: Negative for visual disturbance.  Respiratory: Negative for shortness of breath.   Gastrointestinal: Negative for abdominal pain.  Musculoskeletal: Positive for arthralgias.  Neurological: Negative for headaches.   Allergies  Morphine and related and Oxycodone  Home Medications   Prior to Admission medications   Medication Sig Start Date End Date Taking? Authorizing Provider  acetaminophen (TYLENOL) 650 MG CR tablet Take 650 mg by mouth every 8 (eight) hours as needed for pain.    Historical Provider, MD  desonide (  DESOWEN) 0.05 % cream Apply 1 application topically 2 (two) times daily as needed.     Historical Provider, MD  docusate sodium 100 MG CAPS Take 100 mg by mouth 2 (two) times daily. 12/11/13   Amber Celedonio Savage, PA-C  ferrous sulfate 325 (65 FE) MG tablet Take 1 tablet (325 mg total) by mouth 3 (three) times daily after meals. 12/11/13   Amber Constable, PA-C  fluocinonide (LIDEX) 0.05 % external solution Apply 1 application topically 2 (two) times daily as needed (itching).     Historical Provider, MD  ibuprofen (ADVIL,MOTRIN) 800 MG tablet Take 1 tablet (800 mg total) by mouth 3 (three) times daily. 01/15/15   Maria Hong, MD  ketoconazole (NIZORAL) 2 % cream Apply 1 application topically daily as needed for  irritation.    Historical Provider, MD  ketoconazole (NIZORAL) 2 % shampoo Apply topically 2 (two) times a week.    Historical Provider, MD  levothyroxine (SYNTHROID, LEVOTHROID) 88 MCG tablet Take 88 mcg by mouth daily before breakfast.    Historical Provider, MD  LORazepam (ATIVAN) 1 MG tablet Take 2 mg by mouth at bedtime as needed for anxiety (sleep). For sleep    Historical Provider, MD  methocarbamol (ROBAXIN) 500 MG tablet Take 1 tablet (500 mg total) by mouth every 6 (six) hours as needed for muscle spasms. 12/11/13   Amber Constable, PA-C  naftifine (NAFTIN) 1 % cream Apply 1 application topically daily as needed (for rash).    Historical Provider, MD  omeprazole (PRILOSEC OTC) 20 MG tablet Take 20 mg by mouth daily. TAKES IN AM    Historical Provider, MD  PARoxetine (PAXIL) 30 MG tablet Take 30 mg by mouth every morning.    Historical Provider, MD  rivaroxaban (XARELTO) 10 MG TABS tablet Take 1 tablet (10 mg total) by mouth daily with breakfast. 12/11/13   Dimitri Ped, PA-C  rosuvastatin (CRESTOR) 40 MG tablet Take 40 mg by mouth daily. EVERY AM    Historical Provider, MD  traMADol (ULTRAM) 50 MG tablet Take 1-2 tablets (50-100 mg total) by mouth every 6 (six) hours as needed for moderate pain. 12/11/13   Amber Constable, PA-C  trospium (SANCTURA) 20 MG tablet Take 20 mg by mouth 2 (two) times daily.    Historical Provider, MD   BP 133/55 mmHg  Pulse 74  Temp(Src) 98.6 F (37 C)  Resp 16  Wt 172 lb (78.019 kg)  SpO2 99% Physical Exam  Constitutional: She appears well-developed and well-nourished.  HENT:  Head: Normocephalic and atraumatic.  no facial tenderness, deformity, malocclusion or hemotympanum.  no battle's sign or racoon eyes.   Eyes: Conjunctivae are normal. Right eye exhibits no discharge. Left eye exhibits no discharge.  Pulmonary/Chest: Effort normal. No respiratory distress.  Musculoskeletal: She exhibits tenderness.  No pain with deep breathing. Supple joints,  soft compartments diffusely. TTP to the lateral lower ribs at the costal margin.   Neurological: She is alert. Coordination normal.  Skin: Skin is warm and dry. No rash noted. She is not diaphoretic. No erythema.  Psychiatric: She has a normal mood and affect.  Nursing note and vitals reviewed.  ED Course  Procedures (including critical care time) DIAGNOSTIC STUDIES: Oxygen Saturation is 100% on RA, normal by my interpretation.    COORDINATION OF CARE: 3:17 PM Discussed treatment plan with pt at bedside and pt agreed to plan.   Labs Review Labs Reviewed  CBG MONITORING, ED    Imaging Review Dg Ribs Unilateral W/chest Right  01/15/2015   CLINICAL DATA:  Patient states she fell last night (9/2) in her kitchen with no memory of incident. Patient has history of falls with no memory of incidents afterwards. Complains of right lateral rib pain.  EXAM: RIGHT RIBS AND CHEST - 3+ VIEW  COMPARISON:  12/04/2013  FINDINGS: There is a nondisplaced fracture of the right anterior eleventh rib. There is no evidence of pneumothorax or pleural effusion. There is a nodular opacity at the left lung base of uncertain etiology. Heart size and mediastinal contours are within normal limits.  IMPRESSION: 1. Nondisplaced fracture of the right anterior eleventh rib. 2. Nodular opacity at the left lung base of uncertain etiology. This may reflect an area of atelectasis, but follow-up chest x-ray in 3 months is recommended.   Electronically Signed   By: Elige Ko   On: 01/15/2015 16:03   I have personally reviewed and evaluated these images and lab results as part of my medical decision-making.    MDM   Final diagnoses:  Right rib fracture, closed, initial encounter    xrays show fracture of the R 11th rib - at the same location where the pt has ttp.  There is no PTX.  No SOB, normal VS - she has been given motrin and Rx pain meds and an incentive spirometer - has a friend that lives with her   I have  personally viewed and interpreted the imaging and agree with radiologist interpretation.  Procedure Note:  Definitive Fracture Care:  Definitive fracture care performred for the R rib fracture.  This included analgesia in the ED, incentive spiromoterly, and prescriptions for outpatient pain control which have been provided.  I have counseled the pt on possible complications of the fractures and signs and symptoms which would mandate return for further care as well as the utility of RICE therapy. The patient has expressed their understanding.  Meds given in ED:  Medications  ibuprofen (ADVIL,MOTRIN) tablet 800 mg (800 mg Oral Given 01/15/15 1650)    Discharge Medication List as of 01/15/2015  4:46 PM    START taking these medications   Details  ibuprofen (ADVIL,MOTRIN) 800 MG tablet Take 1 tablet (800 mg total) by mouth 3 (three) times daily., Starting 01/15/2015, Until Discontinued, Print         I personally performed the services described in this documentation, which was scribed in my presence. The recorded information has been reviewed and is accurate.     Maria Hong, MD 01/15/15 [phone]

## 2015-01-15 NOTE — ED Notes (Signed)
States fell approx 2300hrs last pm, stumbled when standing up after attempting to pick up an item she had dropped. Falls are more frequent, family states fell x 5 times in 3 days. Family also states she becomes disoriented at PM

## 2015-01-15 NOTE — ED Notes (Signed)
HIGH FALL RISK for this pt, implemented strategies to prevent further fall, arm bracelet applied, sr x 2 up, bed in lowest position, call bell within reach, family member informed

## 2015-01-15 NOTE — ED Notes (Signed)
Pt c/o fall x 1 day ago with right rib injury

## 2015-01-15 NOTE — ED Notes (Signed)
FAST assessment: negative 

## 2015-01-15 NOTE — Discharge Instructions (Signed)

## 2015-10-13 IMAGING — CR DG CHEST 2V
2 series · 2 of 2 positions shown · non-contrast
Comparison: PA and lateral chest x-ray June 15, 2013.

CLINICAL DATA: Preoperative study

EXAM:
CHEST  2 VIEW

[w chest pa]
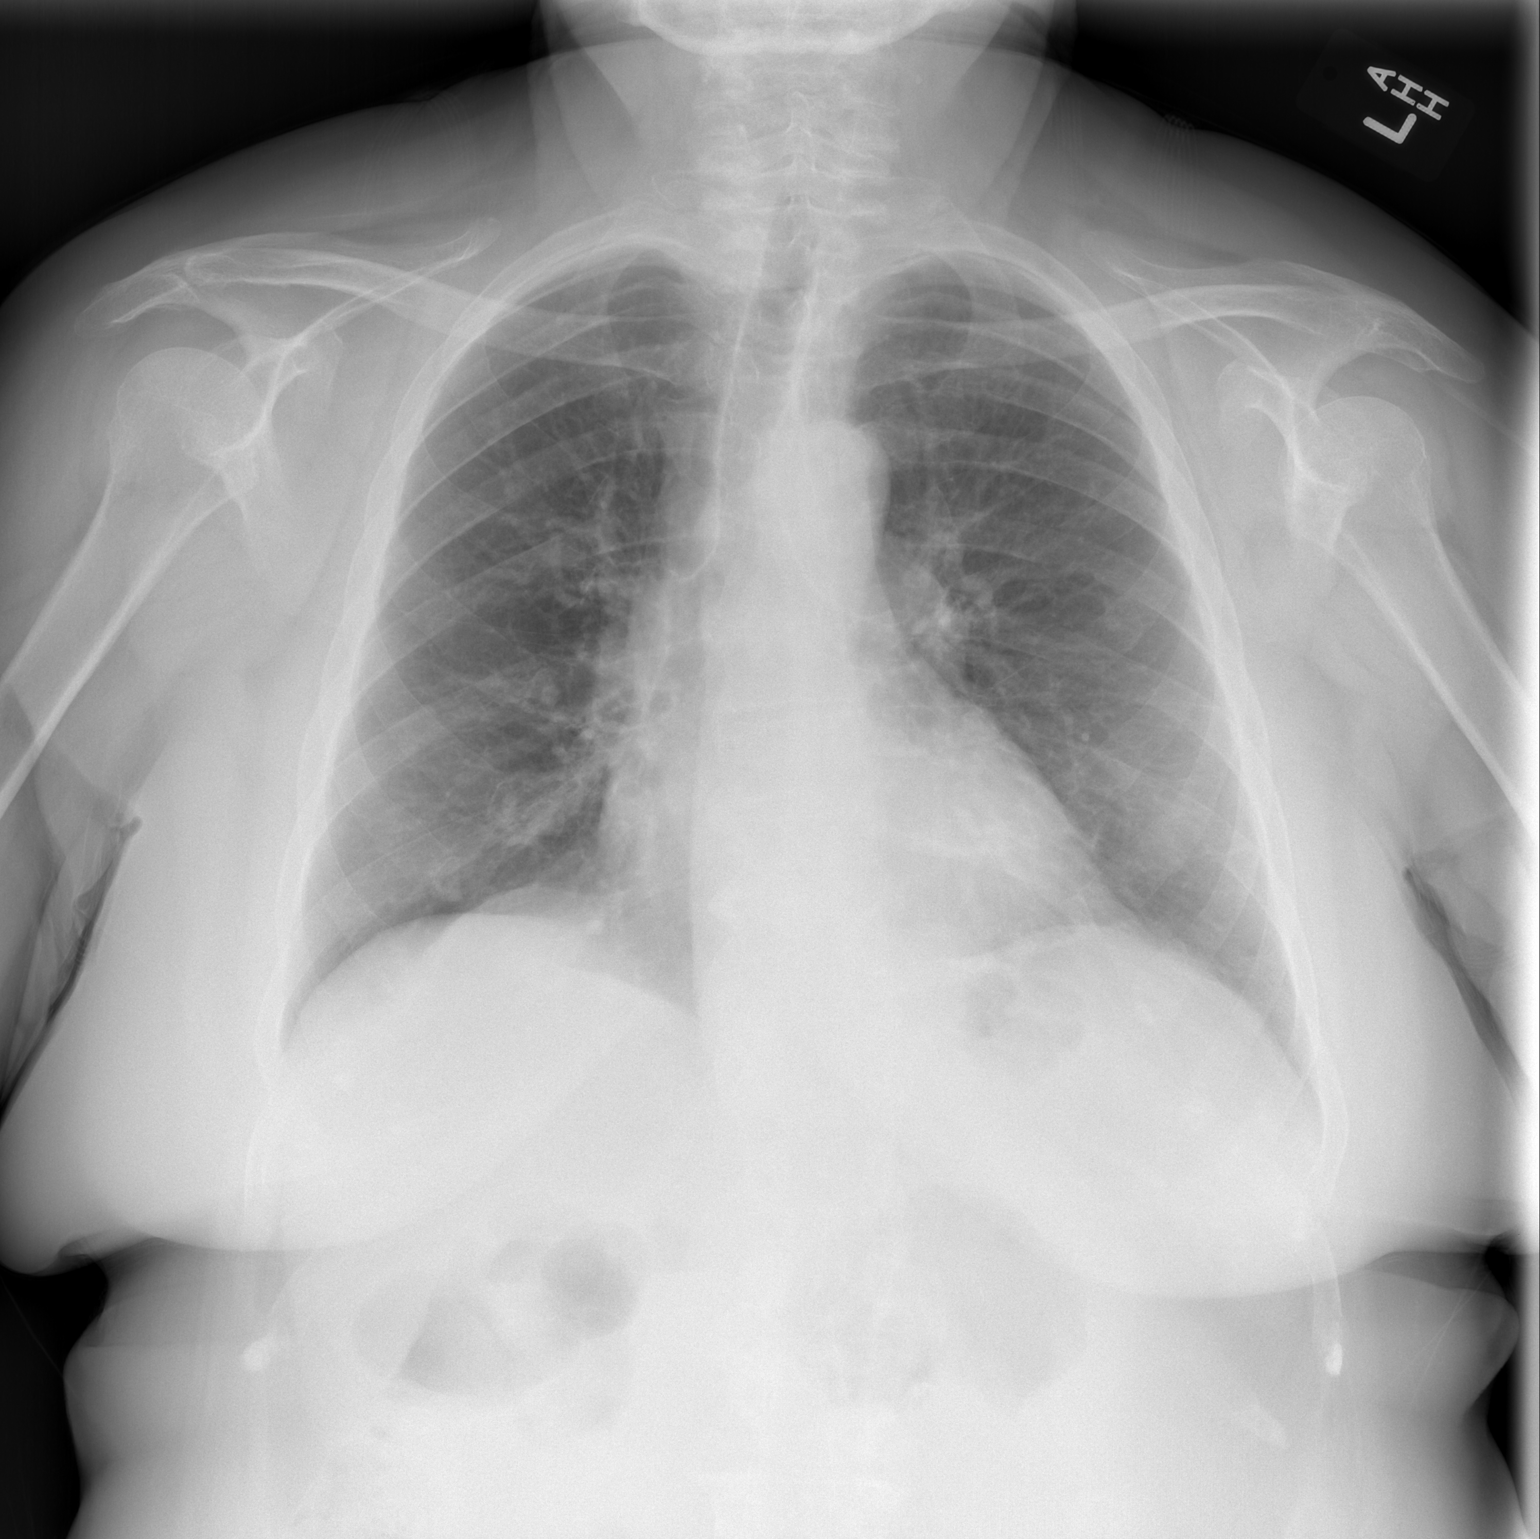

[w chest lat]
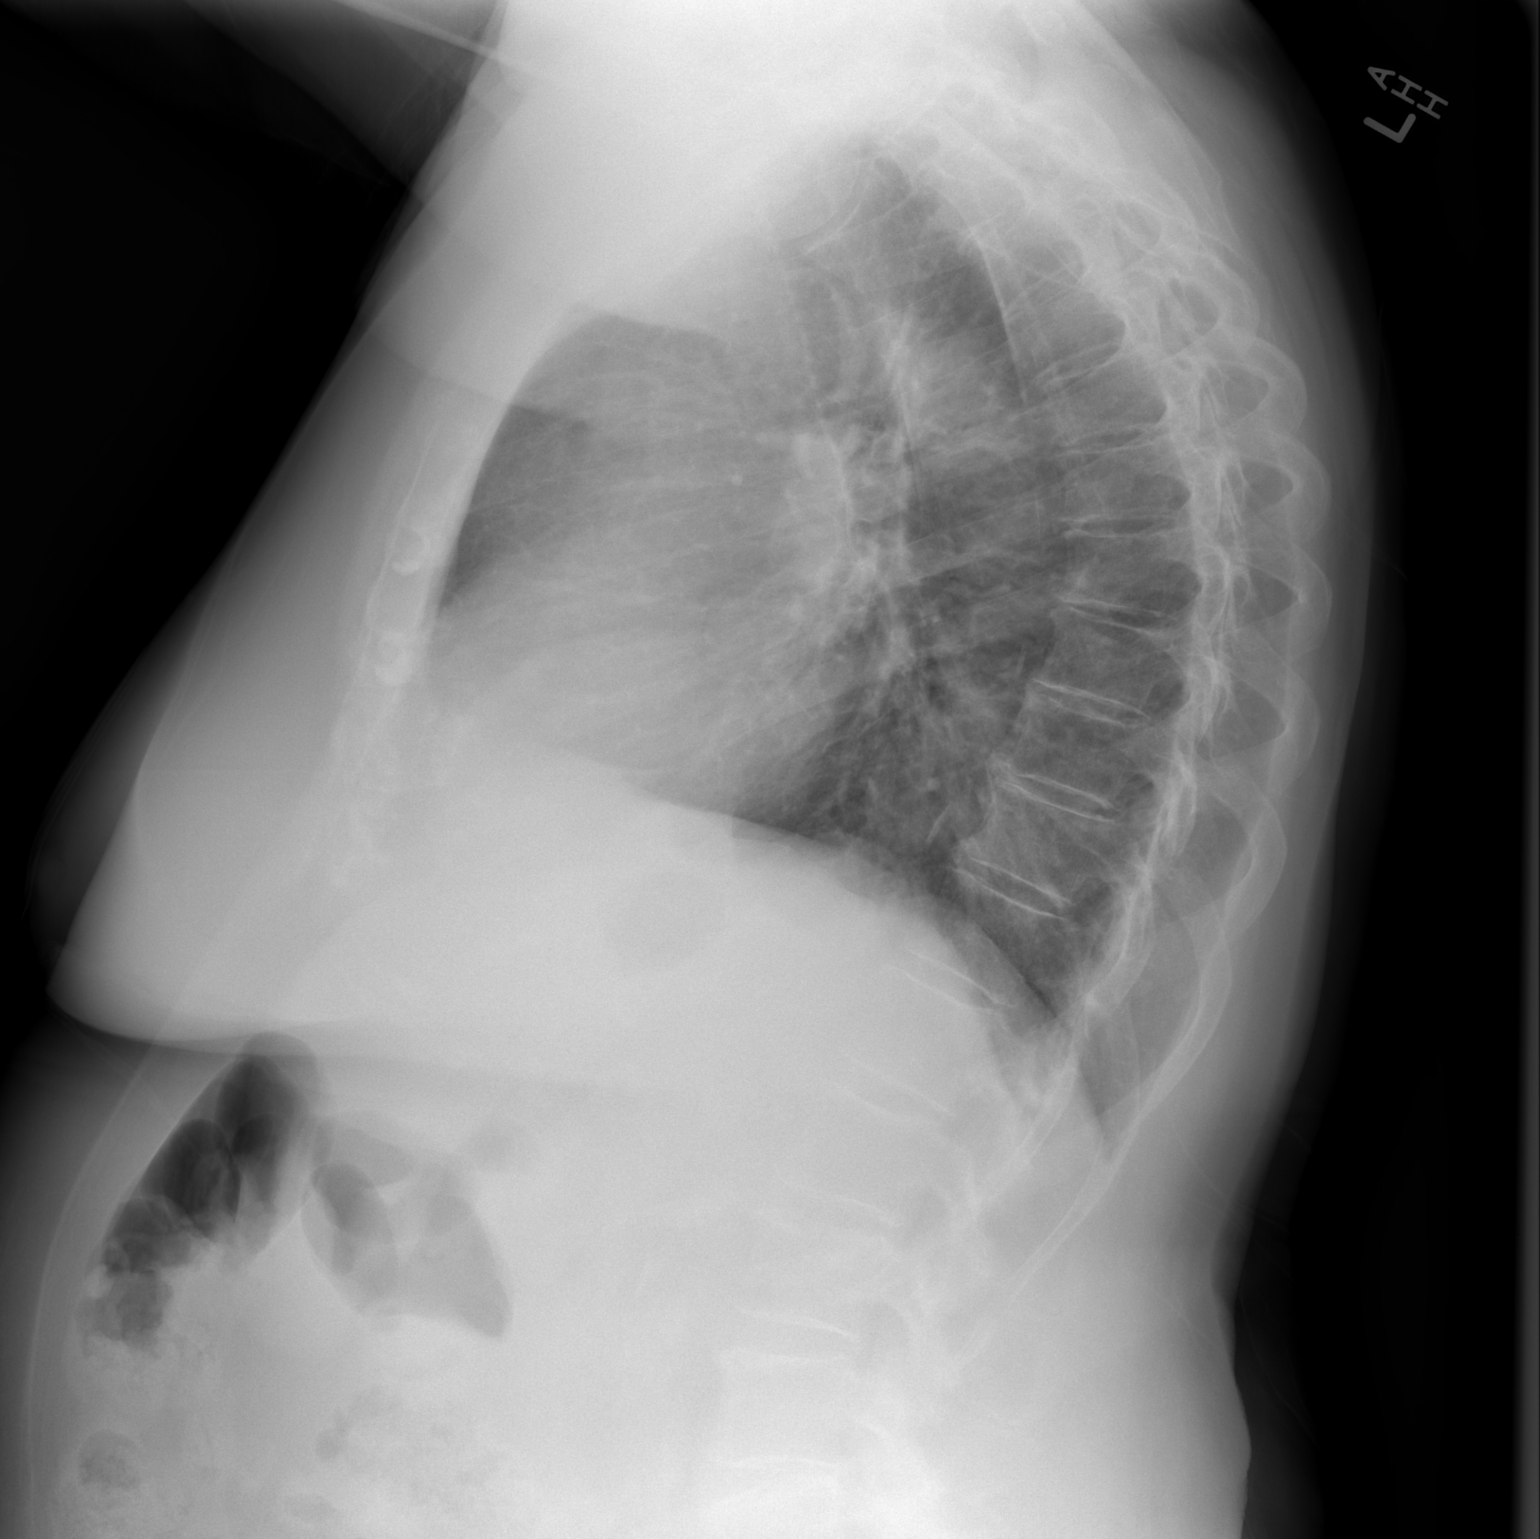

[2 of 2 positions shown; findings below may reference images not displayed]

FINDINGS: The lungs are borderline hyperinflated with hemidiaphragm
flattening. The interstitial markings are mildly prominent though
stable. The heart and pulmonary vascularity are normal. There is no
pleural effusion. The observed bony structures are unremarkable.
IMPRESSION: Mild hyperinflation with chronically increased interstitial marking
may reflect COPD with fibrotic change. There is no pneumonia nor
CHF.

## 2015-10-20 ENCOUNTER — Other Ambulatory Visit: Payer: Self-pay | Admitting: Orthopedic Surgery

## 2015-10-20 DIAGNOSIS — M546 Pain in thoracic spine: Secondary | ICD-10-CM

## 2015-10-26 ENCOUNTER — Ambulatory Visit
Admission: RE | Admit: 2015-10-26 | Discharge: 2015-10-26 | Disposition: A | Discharge status: Home / Self Care | Payer: Medicare Other | Source: Ambulatory Visit | Attending: Orthopedic Surgery | Admitting: Orthopedic Surgery

## 2015-10-26 ENCOUNTER — Other Ambulatory Visit: Payer: Self-pay | Admitting: Orthopedic Surgery

## 2015-10-26 DIAGNOSIS — M546 Pain in thoracic spine: Secondary | ICD-10-CM

## 2016-02-20 ENCOUNTER — Other Ambulatory Visit: Payer: Self-pay | Admitting: Physical Medicine and Rehabilitation

## 2016-02-20 ENCOUNTER — Inpatient Hospital Stay
Admission: RE | Admit: 2016-02-20 | Discharge: 2016-02-20 | Disposition: A | Discharge status: Home / Self Care | Payer: Self-pay | Source: Ambulatory Visit | Attending: Physical Medicine and Rehabilitation | Admitting: Physical Medicine and Rehabilitation

## 2016-02-20 DIAGNOSIS — S22000A Wedge compression fracture of unspecified thoracic vertebra, initial encounter for closed fracture: Secondary | ICD-10-CM

## 2016-02-21 ENCOUNTER — Ambulatory Visit
Admission: RE | Admit: 2016-02-21 | Discharge: 2016-02-21 | Disposition: A | Discharge status: Home / Self Care | Payer: Medicare Other | Source: Ambulatory Visit | Attending: Physical Medicine and Rehabilitation | Admitting: Physical Medicine and Rehabilitation

## 2016-02-21 DIAGNOSIS — S22000A Wedge compression fracture of unspecified thoracic vertebra, initial encounter for closed fracture: Secondary | ICD-10-CM

## 2016-02-21 HISTORY — PX: IR GENERIC HISTORICAL: IMG1180011

## 2016-02-21 NOTE — Consult Note (Signed)
Chief Complaint: Patient was seen in consultation today for  Chief Complaint  Patient presents with  . Back Pain    T 12 compression fracture   at the request of Prueter,Karen  Referring Physician(s): Prueter,Karen  Supervising Physician: Jolaine ClickHoss, Celestial Barnfield  Patient Status: Outpatient  History of Present Illness: Maria Henry Atkison is a 75 y.o. female who developed back pain early last year. She denied any weakness or difficulty walking. She denies a fall. This has slowly improved since then. The pain is in the mid back, and radiates to her flanks and epigastric area. A CT performed 08/2014 showed a T12 fracture. A recent MRI shows that it has nearly healed, with only a minimal amount of edema in the superior endplate, within a thin bone cavity.  Past Medical History:  Diagnosis Date  . Arthritis    OA AND PAIN RT KNEE, ARTHRITIS BOTH HANDS   . Depression   . GERD (gastroesophageal reflux disease)   . Headache(784.0)    hx of MIGRAINES  . Hypothyroidism   . Impaired memory    MILD   . Sleep apnea    PT HAS SLEEP STUDY ABOUT 3 MONTHS AGO - ABOUT APRIL 2015 - TOLD YOU HAVE SLEEP APNEA  - BUT HAS NOT HAD 2ND PART OF THE STUDY TO BE FITTED FOR CPAP  . Stroke [id number]   "in right eye"--PT STATES HER VISION IS OK IN RIGHT EYE    Past Surgical History:  Procedure Laterality Date  . ABDOMINAL HYSTERECTOMY  1994  . BACK SURGERY     lower back  . CARPAL TUNNEL RELEASE Bilateral   . FOOT SURGERY Right   . KNEE ARTHROSCOPY Right 07/01/2012   Procedure: RIGHT KNEE ARTHROSCOPY WITH PARTIAL MEDIAL/LATERAL MENISECTOMY;  Surgeon: Drucilla SchmidtJames P Aplington, MD;  Location: [facility];  Service: Orthopedics;  Laterality: Right;  RIGHT KNEE ARTHROSCOPY WITH PARTIAL MEDIAL/LATERAL MENISECTOMY  . ROTATOR CUFF REPAIR Bilateral    x2 left, x1 right  . TOE SURGERY Right 2011   "tried to straighten"  . TOTAL KNEE ARTHROPLASTY Left 09/16/2012   Procedure: LEFT TOTAL KNEE ARTHROPLASTY;  Surgeon: Drucilla SchmidtJames P Aplington,  MD;  Location: [facility];  Service: Orthopedics;  Laterality: Left;  . TOTAL KNEE ARTHROPLASTY Right 12/10/2013   Procedure: RIGHT TOTAL KNEE ARTHROPLASTY;  Surgeon: Jacki Conesonald A Gioffre, MD;  Location: [facility];  Service: Orthopedics;  Laterality: Right;    Allergies: Morphine and related and Oxycodone  Medications: Prior to Admission medications   Medication Sig Start Date End Date Taking? Authorizing Provider  acetaminophen (TYLENOL) 650 MG CR tablet Take 650 mg by mouth every 8 (eight) hours as needed for pain.   Yes Historical Provider, MD  atorvastatin (LIPITOR) 40 MG tablet Take 40 mg by mouth daily.   Yes Historical Provider, MD  cetirizine (ZYRTEC) 10 MG tablet Take 10 mg by mouth daily.   Yes Historical Provider, MD  clopidogrel (PLAVIX) 75 MG tablet Take 75 mg by mouth daily.   Yes Historical Provider, MD  desonide (DESOWEN) 0.05 % cream Apply 1 application topically 2 (two) times daily as needed.    Yes Historical Provider, MD  hyoscyamine (LEVSIN) 0.125 MG/5ML ELIX Take 0.125 mg by mouth every 4 (four) hours as needed.   Yes Historical Provider, MD  ketoconazole (NIZORAL) 2 % cream Apply 1 application topically daily as needed for irritation.   Yes Historical Provider, MD  ketoconazole (NIZORAL) 2 % shampoo Apply topically 2 (two) times a week.   Yes  Historical Provider, MD  levETIRAcetam (KEPPRA) 500 MG tablet Take 500 mg by mouth 2 (two) times daily.   Yes Historical Provider, MD  levothyroxine (SYNTHROID, LEVOTHROID) 88 MCG tablet Take 88 mcg by mouth daily before breakfast.   Yes Historical Provider, MD  multivitamin-iron-minerals-folic acid (THERAPEUTIC-M) TABS tablet Take 1 tablet by mouth daily.   Yes Historical Provider, MD  nortriptyline (PAMELOR) 50 MG capsule Take 50 mg by mouth at bedtime.   Yes Historical Provider, MD  omeprazole (PRILOSEC OTC) 20 MG tablet Take 20 mg by mouth daily. TAKES IN AM   Yes Historical Provider, MD  pantoprazole (PROTONIX) 20 MG tablet Take 20 mg by  mouth daily.   Yes Historical Provider, MD  PARoxetine (PAXIL) 30 MG tablet Take 30 mg by mouth every morning.   Yes Historical Provider, MD  rosuvastatin (CRESTOR) 40 MG tablet Take 40 mg by mouth daily. EVERY AM   Yes Historical Provider, MD  trospium (SANCTURA) 20 MG tablet Take 20 mg by mouth 2 (two) times daily.   Yes Historical Provider, MD  zolpidem (AMBIEN) 10 MG tablet Take 10 mg by mouth at bedtime as needed for sleep.   Yes Historical Provider, MD  docusate sodium 100 MG CAPS Take 100 mg by mouth 2 (two) times daily. Patient not taking: Reported on 02/21/2016 12/11/13   Dimitri Ped, PA-C  ferrous sulfate 325 (65 FE) MG tablet Take 1 tablet (325 mg total) by mouth 3 (three) times daily after meals. Patient not taking: Reported on 02/21/2016 12/11/13   Dimitri Ped, PA-C  fluocinonide (LIDEX) 0.05 % external solution Apply 1 application topically 2 (two) times daily as needed (itching).     Historical Provider, MD  ibuprofen (ADVIL,MOTRIN) 800 MG tablet Take 1 tablet (800 mg total) by mouth 3 (three) times daily. Patient not taking: Reported on 02/21/2016 01/15/15   Eber Hong, MD  LORazepam (ATIVAN) 1 MG tablet Take 2 mg by mouth at bedtime as needed for anxiety (sleep). For sleep    Historical Provider, MD  methocarbamol (ROBAXIN) 500 MG tablet Take 1 tablet (500 mg total) by mouth every 6 (six) hours as needed for muscle spasms. Patient not taking: Reported on 02/21/2016 12/11/13   Dimitri Ped, PA-C  naftifine (NAFTIN) 1 % cream Apply 1 application topically daily as needed (for rash).    Historical Provider, MD  rivaroxaban (XARELTO) 10 MG TABS tablet Take 1 tablet (10 mg total) by mouth daily with breakfast. Patient not taking: Reported on 02/21/2016 12/11/13   Dimitri Ped, PA-C  traMADol (ULTRAM) 50 MG tablet Take 1-2 tablets (50-100 mg total) by mouth every 6 (six) hours as needed for moderate pain. Patient not taking: Reported on 02/21/2016 12/11/13   Dimitri Ped,  PA-C     No family history on file.  Social History   Social History  . Marital status: Single    Spouse name: N/A  . Number of children: N/A  . Years of education: N/A   Social History Main Topics  . Smoking status: Never Smoker  . Smokeless tobacco: Never Used  . Alcohol use No  . Drug use: No  . Sexual activity: Not on file   Other Topics Concern  . Not on file   Social History Narrative  . No narrative on file     Review of Systems: A 12 point ROS discussed and pertinent positives are indicated in the HPI above.  All other systems are negative.  Review of Systems  Vital Signs: BP  118/64 (BP Location: Right Arm, Patient Position: Sitting, Cuff Size: Normal)   Pulse 77   Temp 98.3 F (36.8 C) (Oral)   Resp 14   Ht 5' (1.524 m)   Wt 155 lb (70.3 kg)   SpO2 98%   BMI 30.27 kg/m   Physical Exam  Constitutional: She is oriented to person, place, and time. She appears well-developed and well-nourished.  Neck: Normal range of motion. Neck supple.  Musculoskeletal:  No midline tenderness in the thoracic or lumbar spine.  Neurological: She is alert and oriented to person, place, and time.  Motor ans sensory are 5/5 throughout.    Mallampati Score:     Imaging: No results found.  A recent MRI shows that the T12 compression fracture is nearly healed. She has had a decompressive laminotomy at L5 without stenosis.  Labs:  CBC: No results for input(s): WBC, HGB, HCT, PLT in the last 8760 hours.  COAGS: No results for input(s): INR, APTT in the last 8760 hours.  BMP: No results for input(s): NA, K, CL, CO2, GLUCOSE, BUN, CALCIUM, CREATININE, GFRNONAA, GFRAA in the last 8760 hours.  Invalid input(s): CMP  LIVER FUNCTION TESTS: No results for input(s): BILITOT, AST, ALT, ALKPHOS, PROT, ALBUMIN in the last 8760 hours.  TUMOR MARKERS: No results for input(s): AFPTM, CEA, CA199, CHROMGRNA in the last 8760 hours.  Assessment and Plan:  Mrs. Rossetti has a  nearly healed T12 compression fracture which si only minimally symptomatic. Because the edama is only a sliver in the superior endplate, there is a low likelihood of improving her residual pain. I would recommend continuing her current course. Epidural corticosteroid injections may be helpful.  Thank you for this interesting consult.  I greatly enjoyed meeting Maria Henry and look forward to participating in their care.  A copy of this report was sent to the requesting provider on this date.  Electronically Signed: Delmy Holdren, ART A 02/21/2016, 10:30 AM   I spent a total of  40 Minutes   in face to face in clinical consultation, greater than 50% of which was counseling/coordinating care for T12 compression fracture.

## 2016-04-03 ENCOUNTER — Encounter: Payer: Self-pay | Admitting: Interventional Radiology

## 2017-11-19 ENCOUNTER — Other Ambulatory Visit: Payer: Self-pay | Admitting: Sports Medicine

## 2017-11-19 ENCOUNTER — Ambulatory Visit: Payer: Medicare Other | Admitting: Sports Medicine

## 2017-11-19 ENCOUNTER — Encounter: Payer: Self-pay | Admitting: Sports Medicine

## 2017-11-19 ENCOUNTER — Ambulatory Visit (INDEPENDENT_AMBULATORY_CARE_PROVIDER_SITE_OTHER): Payer: Medicare Other

## 2017-11-19 VITALS — BP 155/72 | HR 69

## 2017-11-19 DIAGNOSIS — M5410 Radiculopathy, site unspecified: Secondary | ICD-10-CM | POA: Diagnosis not present

## 2017-11-19 DIAGNOSIS — M79671 Pain in right foot: Secondary | ICD-10-CM | POA: Diagnosis not present

## 2017-11-19 DIAGNOSIS — M779 Enthesopathy, unspecified: Secondary | ICD-10-CM | POA: Diagnosis not present

## 2017-11-19 DIAGNOSIS — M19079 Primary osteoarthritis, unspecified ankle and foot: Secondary | ICD-10-CM

## 2017-11-19 DIAGNOSIS — M2040 Other hammer toe(s) (acquired), unspecified foot: Secondary | ICD-10-CM

## 2017-11-19 DIAGNOSIS — M79672 Pain in left foot: Secondary | ICD-10-CM

## 2017-11-19 DIAGNOSIS — M21619 Bunion of unspecified foot: Secondary | ICD-10-CM

## 2017-11-19 NOTE — Progress Notes (Signed)
Subjective: Ramond CraverBrenda J Greenly is a 77 y.o. female patient who presents to office for evaluation of bilateral foot pain. Patient complains of continued pain especially over the last few years in both feet all over. Difficult to rank pain. "feet just hurt" states that achy pain and sometime pain at night that gets better with spacing the toes and reports that she has noted bumps on side of feet and callus skin and numbness sometimes. Patient has tried seeing Dr. Hal Neerillis with no relief in symptoms. Patient denies any other pedal complaints. Denies injury/trip/fall/sprain/any causative factors.   Review of Systems  Musculoskeletal: Positive for back pain, joint pain and myalgias.  Neurological: Positive for sensory change.   On Xarleto for heart.   Patient Active Problem List   Diagnosis Date Noted  . Osteoarthritis of right knee 12/10/2013  . H/O total knee replacement 12/10/2013    Current Outpatient Medications on File Prior to Visit  Medication Sig Dispense Refill  . acetaminophen (TYLENOL) 650 MG CR tablet Take 650 mg by mouth every 8 (eight) hours as needed for pain.    Marland Kitchen. atorvastatin (LIPITOR) 40 MG tablet Take 40 mg by mouth daily.    Marland Kitchen. azaTHIOprine (IMURAN) 50 MG tablet azathioprine 50 mg tablet    . benzonatate (TESSALON) 100 MG capsule benzonatate 100 mg capsule    . carbidopa-levodopa (SINEMET IR) 25-100 MG tablet carbidopa 25 mg-levodopa 100 mg tablet    . cetirizine (ZYRTEC) 10 MG tablet Take 10 mg by mouth daily.    . clopidogrel (PLAVIX) 75 MG tablet Take 75 mg by mouth daily.    Marland Kitchen. desonide (DESOWEN) 0.05 % cream Apply 1 application topically 2 (two) times daily as needed.     Marland Kitchen. dexlansoprazole (DEXILANT) 60 MG capsule Dexilant 60 mg capsule, delayed release    . donepezil (ARICEPT) 10 MG tablet     . fluocinonide (LIDEX) 0.05 % external solution Apply 1 application topically 2 (two) times daily as needed (itching).     . hyoscyamine (LEVSIN SL) 0.125 MG SL tablet hyoscyamine  0.125 mg sublingual tablet    . hyoscyamine (LEVSIN) 0.125 MG/5ML ELIX Take 0.125 mg by mouth every 4 (four) hours as needed.    Marland Kitchen. ketoconazole (NIZORAL) 2 % cream Apply 1 application topically daily as needed for irritation.    Marland Kitchen. ketoconazole (NIZORAL) 2 % shampoo Apply topically 2 (two) times a week.    . levETIRAcetam (KEPPRA) 500 MG tablet Take 500 mg by mouth 2 (two) times daily.    Marland Kitchen. levothyroxine (SYNTHROID, LEVOTHROID) 88 MCG tablet Take 88 mcg by mouth daily before breakfast.    . LORazepam (ATIVAN) 1 MG tablet Take 2 mg by mouth at bedtime as needed for anxiety (sleep). For sleep    . methocarbamol (ROBAXIN) 500 MG tablet Take 1 tablet (500 mg total) by mouth every 6 (six) hours as needed for muscle spasms. 40 tablet 1  . multivitamin-iron-minerals-folic acid (THERAPEUTIC-M) TABS tablet Take 1 tablet by mouth daily.    . naftifine (NAFTIN) 1 % cream Apply 1 application topically daily as needed (for rash).    . nortriptyline (PAMELOR) 50 MG capsule Take 50 mg by mouth at bedtime.    Marland Kitchen. omeprazole (PRILOSEC OTC) 20 MG tablet Take 20 mg by mouth daily. TAKES IN AM    . pantoprazole (PROTONIX) 20 MG tablet Take 20 mg by mouth daily.    Marland Kitchen. PARoxetine (PAXIL) 30 MG tablet Take 30 mg by mouth every morning.    .Marland Kitchen  rosuvastatin (CRESTOR) 40 MG tablet Take 40 mg by mouth daily. EVERY AM    . trospium (SANCTURA) 20 MG tablet Take 20 mg by mouth 2 (two) times daily.    Marland Kitchen zolpidem (AMBIEN) 10 MG tablet Take 10 mg by mouth at bedtime as needed for sleep.    Marland Kitchen docusate sodium 100 MG CAPS Take 100 mg by mouth 2 (two) times daily. 10 capsule 0  . ferrous sulfate 325 (65 FE) MG tablet Take 1 tablet (325 mg total) by mouth 3 (three) times daily after meals. 30 tablet 0  . ibuprofen (ADVIL,MOTRIN) 800 MG tablet Take 1 tablet (800 mg total) by mouth 3 (three) times daily. 21 tablet 0  . rivaroxaban (XARELTO) 10 MG TABS tablet Take 1 tablet (10 mg total) by mouth daily with breakfast. 19 tablet 0  . traMADol  (ULTRAM) 50 MG tablet Take 1-2 tablets (50-100 mg total) by mouth every 6 (six) hours as needed for moderate pain. 90 tablet 0   No current facility-administered medications on file prior to visit.     Allergies  Allergen Reactions  . Morphine And Related     CAUSED NAUSEA AND HALLUCINATIONS  . Oxycodone     PT STATES OXYCODONE MAKES HER VERY SLEEPY    Objective:  General: Alert and oriented x3 in no acute distress  Dermatology: No open lesions bilateral lower extremities, no webspace macerations, no ecchymosis bilateral, all nails x 10 are mildly elongated and thicken but manicured. No distinct callus. Dry plantar skin bilateral.   Vascular: Dorsalis Pedis and Posterior Tibial pedal pulses palpable faint 1/4, Capillary Fill Time 5 seconds,(-) pedal hair growth bilateral, trace edema bilateral lower extremities, + varicosities and trophic skin chages, Temperature gradient within normal limits.  Neurology: Michaell Cowing sensation intact via light touch bilateral, subjective numb feeling to toes bilateral.   Musculoskeletal: Mild tenderness with palpation at lateral styloid process on left and navicular on right and along corresponding tendon attachments, + bunion and hammertoe with limited ROM supportive of OA.   Xrays  Right and Left Foot   Impression:Digital deformity, midtarsal breach with arthritis, decreased bone density and heel spurs. No fracture. Soft tissue margins within normal limits.   Assessment and Plan: Problem List Items Addressed This Visit    None    Visit Diagnoses    Foot pain, bilateral    -  Primary   Relevant Orders   DG Foot Complete Left (Completed)   Arthritis of foot       Bunion       Hammer toe, unspecified laterality       Radicular pain           -Complete examination performed -Xrays reviewed -Discussed treatement options for arthritis and chronic pain with deformity -Dispensed toe spacers -Recommend good supportive shoes and OTC  insoles -Recommend talking with neurologist and rheumatologist about other treatments for foot pain and arthritis  -Recommend OTC pain rubs/patch PRN -Patient to return to office as needed or sooner if condition worsens.  Asencion Islam, DPM

## 2017-11-19 NOTE — Patient Instructions (Signed)
For tennis shoes recommend:  Brooks Beast Ascis New balance Saucony Can be purchased at [employer] or [employer]  Vionic  SAS Can be purchased at [employer] or [employer]   For work shoes recommend: Sketchers Work Timberland boots  Can be purchased at a variety of places or [employer]   For casual shoes recommend: Vionic  Can be purchased at [employer] or [employer]  

## 2022-09-14 ENCOUNTER — Emergency Department (HOSPITAL_BASED_OUTPATIENT_CLINIC_OR_DEPARTMENT_OTHER): Payer: Medicare PPO

## 2022-09-14 ENCOUNTER — Inpatient Hospital Stay (HOSPITAL_BASED_OUTPATIENT_CLINIC_OR_DEPARTMENT_OTHER)
Admission: EM | Admit: 2022-09-14 | Discharge: 2022-09-18 | DRG: 812 | Disposition: A | Discharge status: Home Health | Payer: Medicare PPO | Attending: Internal Medicine | Admitting: Internal Medicine

## 2022-09-14 ENCOUNTER — Other Ambulatory Visit: Payer: Self-pay

## 2022-09-14 ENCOUNTER — Encounter (HOSPITAL_BASED_OUTPATIENT_CLINIC_OR_DEPARTMENT_OTHER): Payer: Self-pay | Admitting: Urology

## 2022-09-14 DIAGNOSIS — R1013 Epigastric pain: Secondary | ICD-10-CM | POA: Diagnosis present

## 2022-09-14 DIAGNOSIS — E872 Acidosis, unspecified: Secondary | ICD-10-CM | POA: Diagnosis present

## 2022-09-14 DIAGNOSIS — D62 Acute posthemorrhagic anemia: Secondary | ICD-10-CM | POA: Diagnosis present

## 2022-09-14 DIAGNOSIS — K754 Autoimmune hepatitis: Secondary | ICD-10-CM

## 2022-09-14 DIAGNOSIS — R1310 Dysphagia, unspecified: Secondary | ICD-10-CM | POA: Diagnosis present

## 2022-09-14 DIAGNOSIS — D649 Anemia, unspecified: Secondary | ICD-10-CM | POA: Diagnosis present

## 2022-09-14 DIAGNOSIS — Z8673 Personal history of transient ischemic attack (TIA), and cerebral infarction without residual deficits: Secondary | ICD-10-CM

## 2022-09-14 DIAGNOSIS — K317 Polyp of stomach and duodenum: Secondary | ICD-10-CM | POA: Diagnosis present

## 2022-09-14 DIAGNOSIS — Z6830 Body mass index (BMI) 30.0-30.9, adult: Secondary | ICD-10-CM

## 2022-09-14 DIAGNOSIS — Z96653 Presence of artificial knee joint, bilateral: Secondary | ICD-10-CM | POA: Diagnosis present

## 2022-09-14 DIAGNOSIS — Z885 Allergy status to narcotic agent status: Secondary | ICD-10-CM

## 2022-09-14 DIAGNOSIS — F0390 Unspecified dementia without behavioral disturbance: Secondary | ICD-10-CM | POA: Diagnosis present

## 2022-09-14 DIAGNOSIS — G4733 Obstructive sleep apnea (adult) (pediatric): Secondary | ICD-10-CM | POA: Diagnosis present

## 2022-09-14 DIAGNOSIS — K7469 Other cirrhosis of liver: Secondary | ICD-10-CM

## 2022-09-14 DIAGNOSIS — E039 Hypothyroidism, unspecified: Secondary | ICD-10-CM | POA: Diagnosis present

## 2022-09-14 DIAGNOSIS — E669 Obesity, unspecified: Secondary | ICD-10-CM | POA: Diagnosis present

## 2022-09-14 DIAGNOSIS — D509 Iron deficiency anemia, unspecified: Principal | ICD-10-CM

## 2022-09-14 DIAGNOSIS — L8990 Pressure ulcer of unspecified site, unspecified stage: Secondary | ICD-10-CM | POA: Insufficient documentation

## 2022-09-14 DIAGNOSIS — K219 Gastro-esophageal reflux disease without esophagitis: Secondary | ICD-10-CM | POA: Diagnosis present

## 2022-09-14 DIAGNOSIS — Z79899 Other long term (current) drug therapy: Secondary | ICD-10-CM

## 2022-09-14 DIAGNOSIS — Z79890 Hormone replacement therapy (postmenopausal): Secondary | ICD-10-CM

## 2022-09-14 DIAGNOSIS — L89151 Pressure ulcer of sacral region, stage 1: Secondary | ICD-10-CM | POA: Diagnosis present

## 2022-09-14 DIAGNOSIS — I8510 Secondary esophageal varices without bleeding: Secondary | ICD-10-CM | POA: Diagnosis present

## 2022-09-14 DIAGNOSIS — R7989 Other specified abnormal findings of blood chemistry: Secondary | ICD-10-CM | POA: Diagnosis present

## 2022-09-14 DIAGNOSIS — Z7901 Long term (current) use of anticoagulants: Secondary | ICD-10-CM

## 2022-09-14 DIAGNOSIS — E785 Hyperlipidemia, unspecified: Secondary | ICD-10-CM | POA: Diagnosis present

## 2022-09-14 DIAGNOSIS — K2970 Gastritis, unspecified, without bleeding: Secondary | ICD-10-CM

## 2022-09-14 DIAGNOSIS — Z86718 Personal history of other venous thrombosis and embolism: Secondary | ICD-10-CM

## 2022-09-14 DIAGNOSIS — I129 Hypertensive chronic kidney disease with stage 1 through stage 4 chronic kidney disease, or unspecified chronic kidney disease: Secondary | ICD-10-CM | POA: Diagnosis present

## 2022-09-14 DIAGNOSIS — Z7902 Long term (current) use of antithrombotics/antiplatelets: Secondary | ICD-10-CM

## 2022-09-14 DIAGNOSIS — K3189 Other diseases of stomach and duodenum: Secondary | ICD-10-CM | POA: Diagnosis present

## 2022-09-14 DIAGNOSIS — N1832 Chronic kidney disease, stage 3b: Secondary | ICD-10-CM | POA: Diagnosis present

## 2022-09-14 HISTORY — DX: Unspecified dementia, unspecified severity, without behavioral disturbance, psychotic disturbance, mood disturbance, and anxiety: F03.90

## 2022-09-14 HISTORY — DX: Chronic kidney disease, unspecified: N18.9

## 2022-09-14 LAB — D-DIMER, QUANTITATIVE: D-Dimer, Quant: 0.56 ug/mL-FEU — ABNORMAL HIGH (ref 0.00–0.50)

## 2022-09-14 LAB — BASIC METABOLIC PANEL
Anion gap: 7 (ref 5–15)
BUN: 18 mg/dL (ref 8–23)
CO2: 19 mmol/L — ABNORMAL LOW (ref 22–32)
Calcium: 8.5 mg/dL — ABNORMAL LOW (ref 8.9–10.3)
Chloride: 116 mmol/L — ABNORMAL HIGH (ref 98–111)
Creatinine, Ser: 1.76 mg/dL — ABNORMAL HIGH (ref 0.44–1.00)
GFR, Estimated: 29 mL/min — ABNORMAL LOW (ref >60)
Glucose, Bld: 99 mg/dL (ref 70–99)
Potassium: 4 mmol/L (ref 3.5–5.1)
Sodium: 142 mmol/L (ref 135–145)

## 2022-09-14 LAB — CBC
Hemoglobin: 6.9 g/dL — CL (ref 12.0–15.0)
MCHC: 29.7 g/dL — ABNORMAL LOW (ref 30.0–36.0)
RBC: 2.34 MIL/uL — ABNORMAL LOW (ref 3.87–5.11)

## 2022-09-14 LAB — TROPONIN I (HIGH SENSITIVITY): Troponin I (High Sensitivity): 7 ng/L (ref <18)

## 2022-09-14 LAB — OCCULT BLOOD X 1 CARD TO LAB, STOOL: Fecal Occult Bld: NEGATIVE

## 2022-09-14 NOTE — ED Provider Notes (Signed)
[facility] Provider Note   CSN: [id number] Arrival date & time: 09/14/22  1801     History  Chief Complaint  Patient presents with   Shoulder Pain    Maria Henry is a 82 y.o. female history of DVT on Eliquis, autoimmune otitis, hypothyroidism, hypertension, dementia, CKD, stroke, substernal chest pain that began this morning.  Patient states that it is substernal and does not radiate.  Patient states that pressing on her chest exacerbates the pain but also taking a deep breath as well.  Patient is currently on Eliquis and has not taken any pain meds.  Patient denies any new onset weakness.  Patient is unsure of any alleviating factors.  Friend is present to assist in history  Patient denied recent travel, hemoptysis, personal history of cancer, dysuria, abdominal pain, nausea/vomiting  *Triage note states that patient is here for right shoulder pain however patient and friend adamantly deny that is the cause of them arriving to the ER  Home Medications Prior to Admission medications   Medication Sig Start Date End Date Taking? Authorizing Provider  acetaminophen (TYLENOL) 650 MG CR tablet Take 650 mg by mouth every 8 (eight) hours as needed for pain.    [provider]  atorvastatin (LIPITOR) 40 MG tablet Take 40 mg by mouth daily.    [provider]  azaTHIOprine (IMURAN) 50 MG tablet azathioprine 50 mg tablet 10/21/17   [provider]  benzonatate (TESSALON) 100 MG capsule benzonatate 100 mg capsule    [provider]  carbidopa-levodopa (SINEMET IR) 25-100 MG tablet carbidopa 25 mg-levodopa 100 mg tablet    [provider]  cetirizine (ZYRTEC) 10 MG tablet Take 10 mg by mouth daily.    [provider]  clopidogrel (PLAVIX) 75 MG tablet Take 75 mg by mouth daily.    [provider]  desonide (DESOWEN) 0.05 % cream Apply 1 application topically 2 (two) times daily as  needed.     [provider]  dexlansoprazole (DEXILANT) 60 MG capsule Dexilant 60 mg capsule, delayed release 10/31/17   [provider]  docusate sodium 100 MG CAPS Take 100 mg by mouth 2 (two) times daily. 12/11/13   Porterfield, Amber, PA-C  donepezil (ARICEPT) 10 MG tablet  10/21/17   [provider]  ferrous sulfate 325 (65 FE) MG tablet Take 1 tablet (325 mg total) by mouth 3 (three) times daily after meals. 12/11/13   Porterfield, Amber, PA-C  fluocinonide (LIDEX) 0.05 % external solution Apply 1 application topically 2 (two) times daily as needed (itching).     [provider]  hyoscyamine (LEVSIN SL) 0.125 MG SL tablet hyoscyamine 0.125 mg sublingual tablet 07/26/17   [provider]  hyoscyamine (LEVSIN) 0.125 MG/5ML ELIX Take 0.125 mg by mouth every 4 (four) hours as needed.    [provider]  ibuprofen (ADVIL,MOTRIN) 800 MG tablet Take 1 tablet (800 mg total) by mouth 3 (three) times daily. 01/15/15   Eber Hong, MD  ketoconazole (NIZORAL) 2 % cream Apply 1 application topically daily as needed for irritation.    [provider]  ketoconazole (NIZORAL) 2 % shampoo Apply topically 2 (two) times a week.    [provider]  levETIRAcetam (KEPPRA) 500 MG tablet Take 500 mg by mouth 2 (two) times daily.    [provider]  levothyroxine (SYNTHROID, LEVOTHROID) 88 MCG tablet Take 88 mcg by mouth daily before breakfast.    [provider]  LORazepam (ATIVAN) 1 MG tablet Take 2 mg by mouth at bedtime as needed for anxiety (sleep). For sleep    [provider]  methocarbamol (ROBAXIN) 500 MG tablet Take 1 tablet (500 mg total) by mouth every 6 (six) hours as needed for muscle spasms. 12/11/13   Porterfield, Amber, PA-C  multivitamin-iron-minerals-folic acid (THERAPEUTIC-M) TABS tablet Take 1 tablet by mouth daily.    [provider]  naftifine (NAFTIN) 1 % cream Apply 1 application topically  daily as needed (for rash).    [provider]  nortriptyline (PAMELOR) 50 MG capsule Take 50 mg by mouth at bedtime.    [provider]  omeprazole (PRILOSEC OTC) 20 MG tablet Take 20 mg by mouth daily. TAKES IN AM    [provider]  pantoprazole (PROTONIX) 20 MG tablet Take 20 mg by mouth daily.    [provider]  PARoxetine (PAXIL) 30 MG tablet Take 30 mg by mouth every morning.    [provider]  rivaroxaban (XARELTO) 10 MG TABS tablet Take 1 tablet (10 mg total) by mouth daily with breakfast. 12/11/13   Porterfield, Amber, PA-C  rosuvastatin (CRESTOR) 40 MG tablet Take 40 mg by mouth daily. EVERY AM    [provider]  traMADol (ULTRAM) 50 MG tablet Take 1-2 tablets (50-100 mg total) by mouth every 6 (six) hours as needed for moderate pain. 12/11/13   Porterfield, Amber, PA-C  trospium (SANCTURA) 20 MG tablet Take 20 mg by mouth 2 (two) times daily.    [provider]  zolpidem (AMBIEN) 10 MG tablet Take 10 mg by mouth at bedtime as needed for sleep.    [provider]      Allergies    Morphine and related and Oxycodone    Review of Systems   Review of Systems See HPI Physical Exam Updated Vital Signs BP (!) 143/51   Pulse 77   Temp 98.2 F (36.8 C) (Oral)   Resp 16   Ht 5' (1.524 m)   Wt 70.3 kg   SpO2 97%   BMI 30.27 kg/m  Physical Exam Vitals reviewed.  Constitutional:      General: She is not in acute distress. HENT:     Head: Normocephalic and atraumatic.  Eyes:     Extraocular Movements: Extraocular movements intact.     Conjunctiva/sclera: Conjunctivae normal.     Pupils: Pupils are equal, round, and reactive to light.  Cardiovascular:     Rate and Rhythm: Normal rate and regular rhythm.     Pulses: Normal pulses.     Heart sounds: Normal heart sounds.     Comments: 2+ bilateral radial/dorsalis pedis pulses with regular rate Pulmonary:     Effort: Pulmonary effort is normal. No  respiratory distress.     Breath sounds: Normal breath sounds.  Abdominal:     Palpations: Abdomen is soft.     Tenderness: There is no abdominal tenderness. There is no guarding or rebound.  Genitourinary:    Comments: Chaperone: Mykenzie Cochrane, RN Brown stool noted No rectal abnormalities noted No hemorrhoids noted No obvious bleeding noted Musculoskeletal:        General: Normal range of motion.     Cervical back: Normal range of motion and neck supple.     Comments: 5 out of 5 bilateral grip/leg extension strength Chest pain reproducible with palpation  Skin:    General: Skin is warm and dry.     Capillary Refill: Capillary refill  takes less than 2 seconds.     Coloration: Skin is pale.  Neurological:     General: No focal deficit present.     Mental Status: She is alert and oriented to person, place, and time.     Comments: Sensation intact in all 4 limbs  Psychiatric:        Mood and Affect: Mood normal.     Comments: Slowed thought process     ED Results / Procedures / Treatments   Labs (all labs ordered are listed, but only abnormal results are displayed) Labs Reviewed  BASIC METABOLIC PANEL - Abnormal; Notable for the following components:      Result Value   Chloride 116 (*)    CO2 19 (*)    Creatinine, Ser 1.76 (*)    Calcium 8.5 (*)    GFR, Estimated 29 (*)    All other components within normal limits  CBC - Abnormal; Notable for the following components:   RBC 2.34 (*)    Hemoglobin 6.9 (*)    HCT 23.2 (*)    MCHC 29.7 (*)    RDW 17.8 (*)    All other components within normal limits  D-DIMER, QUANTITATIVE - Abnormal; Notable for the following components:   D-Dimer, Quant 0.56 (*)    All other components within normal limits  OCCULT BLOOD X 1 CARD TO LAB, STOOL  POC OCCULT BLOOD, ED  TROPONIN I (HIGH SENSITIVITY)  TROPONIN I (HIGH SENSITIVITY)    EKG None  Radiology DG Chest Port 1 View  Result Date: 09/14/2022 CLINICAL DATA:  Chest pain,  right hand and shoulder pain EXAM: PORTABLE CHEST 1 VIEW COMPARISON:  01/15/2015 FINDINGS: Single frontal view of the chest demonstrates a stable cardiac silhouette. No airspace disease, effusion, or pneumothorax. No acute bony abnormalities. IMPRESSION: 1. No acute intrathoracic process. Electronically Signed   By: Sharlet Salina M.D.   On: 09/14/2022 22:49   DG Shoulder Right  Result Date: 09/14/2022 CLINICAL DATA:  Right shoulder pain, pain with movement EXAM: RIGHT SHOULDER - 2+ VIEW COMPARISON:  None Available. FINDINGS: Internal rotation, external rotation, and transscapular views of the right shoulder are obtained. No acute displaced fracture, subluxation, or dislocation. Moderate hypertrophic changes are seen of the acromioclavicular joint. Mild spurring of the undersurface of the acromion process. Mild glenohumeral joint osteoarthritis. Soft tissues are unremarkable. Right chest is clear. IMPRESSION: 1. Moderate acromioclavicular joint osteoarthropathy. 2. Mild glenohumeral joint osteoarthritis. 3. No acute displaced fracture. Electronically Signed   By: Sharlet Salina M.D.   On: 09/14/2022 18:35    Procedures .Critical Care  Performed by: Netta Corrigan, PA-C Authorized by: Netta Corrigan, PA-C   Critical care provider statement:    Critical care time (minutes):  30   Critical care was necessary to treat or prevent imminent or life-threatening deterioration of the following conditions: severe anemia.   Critical care was time spent personally by me on the following activities:  Development of treatment plan with patient or surrogate, discussions with consultants, evaluation of patient's response to treatment, examination of patient, obtaining history from patient or surrogate, review of old charts, re-evaluation of patient's condition, pulse oximetry, ordering and review of radiographic studies, ordering and review of laboratory studies and ordering and performing treatments and  interventions   I assumed direction of critical care for this patient from another provider in my specialty: no     Care discussed with: admitting provider       Medications Ordered in  ED Medications - No data to display  ED Course/ Medical Decision Making/ A&P                             Medical Decision Making Amount and/or Complexity of Data Reviewed Labs: ordered. Radiology: ordered.  Risk Decision regarding hospitalization.   Maria Henry 82 y.o. presented today for chest pain. Working DDx that I considered at this time includes, but not limited to, ACS, GERD, pulmonary embolism, community-acquired pneumonia, aortic dissection, pneumothorax, underlying bony abnormality, anemia, thyrotoxicosis, esophageal rupture.    R/o Dx: ACS, GERD, community-acquired pneumonia, aortic dissection, pneumothorax, underlying bony abnormality, anemia, thyrotoxicosis, esophageal rupture: These are considered less likely due to history of present illness and physical exam findings. Aortic Dissection: less likely based on the location, quality, onset, and severity of symptoms in this case. Patient also has a lack of underlying history of AD or TAA.   Review of prior external notes: 09/11/2022 office visit  Unique Tests and My Interpretation:  EKG: Rate, rhythm, axis, intervals all examined and without medically relevant abnormality. ST segments without concerns for elevations Troponin: 7, pending second troponin CXR: No acute cardiopulmonary changes CBC: Hemoglobin 6.9, this is down from last reading at 05/31/2022 of 9.6 BMP: GFR 29, creatinine 1.76, similar to previous readings Right shoulder x-ray: Moderate acromioclavicular arthritis, mild glenohumeral arthritis D-dimer: Elevated Occult blood card: Negative  Discussion with Independent Historian:  Friend  Discussion of Management of Tests:  Opyd, MD Hospitalist  Risk: High: hospitalization or escalation of hospital-level care  Risk  Stratification Score: HEART: 4 (age, risk factors) Wells score for PE: 1.5 (previous DVT)  Plan: Patient presented for chest pain. On exam patient was in no acute distress and stable vitals. Patient's physical was remarkable for reproducing chest pain with palpation. Labs and CXR will be ordered.  Low suspicion for any life-threatening diagnoses at this time however patient does have recent DVT on Eliquis and given her age and endorsing shortness of breath a CTA will be ordered.  Patient stable at this time.  Patient's hemoglobin came back significant for a hemoglobin of 6.9.  Patient's last hemoglobin back in January was 9.6 however patient has been on Eliquis over the past few months which I suspect is the cause.  Patient denies any melena or hematochezia and a rectal exam was performed that did not show any obvious bleeding.  Occult test was negative.  Patient's chest x-ray was also reassuring.  D-dimer was elevated however patient has decreased kidney function and a CTA cannot be performed at this time.  At this time hospitalist was consulted for admission due to new onset anemia and needing a VQ scan to assess for possible PE.  Spoke to hospitalist and hospitalist accepted patient for admission.  Patient is stable at this time for admission.         Final Clinical Impression(s) / ED Diagnoses Final diagnoses:  Anemia, unspecified type    Rx / DC Orders ED Discharge Orders     None         Remi Deter 09/14/22 2337    Lonell Grandchild, MD 09/15/22 1215

## 2022-09-14 NOTE — ED Notes (Signed)
Pt c/o mid center chest pain that goes right through to mid center back pain, pt denies shoulder pain, states she is having some difficulty moving the R arm. Chest pain worse w/ palpation. Abd tender to palpation

## 2022-09-14 NOTE — ED Triage Notes (Signed)
Pt states right hand pain that started tonight while at dinner at 1700 States pain now in right shoulder blade of back, normal ROM but pain with movement of right shoulder  Denies any injury, denies fall  H/o rotator cuff sx on both shoulders,   H/o stroke   BEFAST negative, NIH 0

## 2022-09-15 ENCOUNTER — Observation Stay (HOSPITAL_BASED_OUTPATIENT_CLINIC_OR_DEPARTMENT_OTHER): Payer: Medicare PPO

## 2022-09-15 ENCOUNTER — Other Ambulatory Visit: Payer: Self-pay

## 2022-09-15 ENCOUNTER — Observation Stay (HOSPITAL_COMMUNITY): Payer: Medicare PPO

## 2022-09-15 ENCOUNTER — Encounter (HOSPITAL_BASED_OUTPATIENT_CLINIC_OR_DEPARTMENT_OTHER): Payer: Self-pay | Admitting: Family Medicine

## 2022-09-15 DIAGNOSIS — Z79890 Hormone replacement therapy (postmenopausal): Secondary | ICD-10-CM | POA: Diagnosis not present

## 2022-09-15 DIAGNOSIS — D649 Anemia, unspecified: Secondary | ICD-10-CM | POA: Diagnosis present

## 2022-09-15 DIAGNOSIS — E785 Hyperlipidemia, unspecified: Secondary | ICD-10-CM | POA: Diagnosis not present

## 2022-09-15 DIAGNOSIS — G4733 Obstructive sleep apnea (adult) (pediatric): Secondary | ICD-10-CM | POA: Diagnosis not present

## 2022-09-15 DIAGNOSIS — I8510 Secondary esophageal varices without bleeding: Secondary | ICD-10-CM | POA: Diagnosis not present

## 2022-09-15 DIAGNOSIS — Z885 Allergy status to narcotic agent status: Secondary | ICD-10-CM | POA: Diagnosis not present

## 2022-09-15 DIAGNOSIS — K2970 Gastritis, unspecified, without bleeding: Secondary | ICD-10-CM | POA: Diagnosis not present

## 2022-09-15 DIAGNOSIS — E669 Obesity, unspecified: Secondary | ICD-10-CM | POA: Diagnosis not present

## 2022-09-15 DIAGNOSIS — K317 Polyp of stomach and duodenum: Secondary | ICD-10-CM | POA: Diagnosis not present

## 2022-09-15 DIAGNOSIS — Z6830 Body mass index (BMI) 30.0-30.9, adult: Secondary | ICD-10-CM | POA: Diagnosis not present

## 2022-09-15 DIAGNOSIS — K7469 Other cirrhosis of liver: Secondary | ICD-10-CM | POA: Diagnosis not present

## 2022-09-15 DIAGNOSIS — K3189 Other diseases of stomach and duodenum: Secondary | ICD-10-CM | POA: Diagnosis not present

## 2022-09-15 DIAGNOSIS — E039 Hypothyroidism, unspecified: Secondary | ICD-10-CM | POA: Diagnosis not present

## 2022-09-15 DIAGNOSIS — E872 Acidosis, unspecified: Secondary | ICD-10-CM | POA: Diagnosis not present

## 2022-09-15 DIAGNOSIS — K754 Autoimmune hepatitis: Secondary | ICD-10-CM | POA: Diagnosis not present

## 2022-09-15 DIAGNOSIS — Z96653 Presence of artificial knee joint, bilateral: Secondary | ICD-10-CM | POA: Diagnosis not present

## 2022-09-15 DIAGNOSIS — L89151 Pressure ulcer of sacral region, stage 1: Secondary | ICD-10-CM | POA: Diagnosis not present

## 2022-09-15 DIAGNOSIS — D62 Acute posthemorrhagic anemia: Secondary | ICD-10-CM | POA: Diagnosis not present

## 2022-09-15 DIAGNOSIS — K219 Gastro-esophageal reflux disease without esophagitis: Secondary | ICD-10-CM | POA: Diagnosis not present

## 2022-09-15 DIAGNOSIS — N1832 Chronic kidney disease, stage 3b: Secondary | ICD-10-CM | POA: Diagnosis not present

## 2022-09-15 DIAGNOSIS — I82409 Acute embolism and thrombosis of unspecified deep veins of unspecified lower extremity: Secondary | ICD-10-CM | POA: Diagnosis not present

## 2022-09-15 DIAGNOSIS — F0390 Unspecified dementia without behavioral disturbance: Secondary | ICD-10-CM | POA: Diagnosis not present

## 2022-09-15 DIAGNOSIS — I129 Hypertensive chronic kidney disease with stage 1 through stage 4 chronic kidney disease, or unspecified chronic kidney disease: Secondary | ICD-10-CM | POA: Diagnosis not present

## 2022-09-15 DIAGNOSIS — Z86718 Personal history of other venous thrombosis and embolism: Secondary | ICD-10-CM | POA: Diagnosis not present

## 2022-09-15 DIAGNOSIS — D509 Iron deficiency anemia, unspecified: Secondary | ICD-10-CM | POA: Diagnosis not present

## 2022-09-15 DIAGNOSIS — Z8673 Personal history of transient ischemic attack (TIA), and cerebral infarction without residual deficits: Secondary | ICD-10-CM | POA: Diagnosis not present

## 2022-09-15 LAB — FERRITIN: Ferritin: 5 ng/mL — ABNORMAL LOW (ref 11–307)

## 2022-09-15 LAB — COMPREHENSIVE METABOLIC PANEL
ALT: 11 U/L (ref 0–44)
AST: 15 U/L (ref 15–41)
Albumin: 3.2 g/dL — ABNORMAL LOW (ref 3.5–5.0)
Alkaline Phosphatase: 67 U/L (ref 38–126)
Anion gap: 9 (ref 5–15)
BUN: 14 mg/dL (ref 8–23)
CO2: 18 mmol/L — ABNORMAL LOW (ref 22–32)
Calcium: 8.3 mg/dL — ABNORMAL LOW (ref 8.9–10.3)
Chloride: 115 mmol/L — ABNORMAL HIGH (ref 98–111)
Creatinine, Ser: 1.61 mg/dL — ABNORMAL HIGH (ref 0.44–1.00)
GFR, Estimated: 32 mL/min — ABNORMAL LOW (ref >60)
Glucose, Bld: 114 mg/dL — ABNORMAL HIGH (ref 70–99)
Potassium: 3.9 mmol/L (ref 3.5–5.1)
Sodium: 142 mmol/L (ref 135–145)
Total Bilirubin: 0.5 mg/dL (ref 0.3–1.2)
Total Protein: 5.8 g/dL — ABNORMAL LOW (ref 6.5–8.1)

## 2022-09-15 LAB — PREPARE RBC (CROSSMATCH)

## 2022-09-15 LAB — CBC
HCT: 25.9 % — ABNORMAL LOW (ref 36.0–46.0)
Hemoglobin: 7.7 g/dL — ABNORMAL LOW (ref 12.0–15.0)
MCH: 29.3 pg (ref 26.0–34.0)
MCHC: 29.7 g/dL — ABNORMAL LOW (ref 30.0–36.0)
MCV: 98.5 fL (ref 80.0–100.0)
Platelets: 216 10*3/uL (ref 150–400)
RBC: 2.63 MIL/uL — ABNORMAL LOW (ref 3.87–5.11)
RDW: 17.1 % — ABNORMAL HIGH (ref 11.5–15.5)
WBC: 5.4 10*3/uL (ref 4.0–10.5)
nRBC: 0 % (ref 0.0–0.2)

## 2022-09-15 LAB — MRSA NEXT GEN BY PCR, NASAL: MRSA by PCR Next Gen: NOT DETECTED

## 2022-09-15 LAB — PROTIME-INR
INR: 1.3 — ABNORMAL HIGH (ref 0.8–1.2)
Prothrombin Time: 16.1 seconds — ABNORMAL HIGH (ref 11.4–15.2)

## 2022-09-15 LAB — HEMOGLOBIN AND HEMATOCRIT, BLOOD
HCT: 25.9 % — ABNORMAL LOW (ref 36.0–46.0)
Hemoglobin: 7.7 g/dL — ABNORMAL LOW (ref 12.0–15.0)

## 2022-09-15 LAB — IRON AND TIBC
Iron: 16 ug/dL — ABNORMAL LOW (ref 28–170)
Saturation Ratios: 5 % — ABNORMAL LOW (ref 10.4–31.8)
TIBC: 339 ug/dL (ref 250–450)
UIBC: 323 ug/dL

## 2022-09-15 LAB — MAGNESIUM: Magnesium: 1.5 mg/dL — ABNORMAL LOW (ref 1.7–2.4)

## 2022-09-15 LAB — PHOSPHORUS: Phosphorus: 2.7 mg/dL (ref 2.5–4.6)

## 2022-09-15 LAB — TROPONIN I (HIGH SENSITIVITY): Troponin I (High Sensitivity): 7 ng/L (ref <18)

## 2022-09-15 MED ORDER — ONDANSETRON HCL 4 MG/2ML IJ SOLN
4.0000 mg | Freq: Four times a day (QID) | INTRAMUSCULAR | Status: DC | PRN
  Administered 2022-09-15 – 2022-09-17 (×3): 4 mg via INTRAVENOUS
  Filled 2022-09-15 (×2): qty 2

## 2022-09-15 MED ORDER — IOHEXOL 9 MG/ML PO SOLN: 500.0000 mL | ORAL | Status: AC

## 2022-09-15 MED ORDER — SODIUM CHLORIDE 0.9% IV SOLUTION: Freq: Once | INTRAVENOUS | Status: AC

## 2022-09-15 MED ORDER — HYDROMORPHONE HCL 1 MG/ML IJ SOLN
0.5000 mg | INTRAMUSCULAR | Status: AC
  Administered 2022-09-15: 0.5 mg via INTRAVENOUS
  Filled 2022-09-15: qty 0.5

## 2022-09-15 MED ORDER — PANTOPRAZOLE SODIUM 40 MG IV SOLR
40.0000 mg | Freq: Two times a day (BID) | INTRAVENOUS | Status: DC
  Administered 2022-09-15 – 2022-09-17 (×6): 40 mg via INTRAVENOUS
  Filled 2022-09-15 (×6): qty 10

## 2022-09-15 MED ORDER — LACTATED RINGERS IV SOLN: INTRAVENOUS | Status: AC

## 2022-09-15 MED ORDER — PANTOPRAZOLE SODIUM 40 MG PO TBEC: 40.0000 mg | DELAYED_RELEASE_TABLET | Freq: Two times a day (BID) | ORAL | Status: DC

## 2022-09-15 MED ORDER — MAGNESIUM SULFATE 4 GM/100ML IV SOLN
4.0000 g | Freq: Once | INTRAVENOUS | Status: AC
  Administered 2022-09-15: 4 g via INTRAVENOUS
  Filled 2022-09-15: qty 100

## 2022-09-15 MED ORDER — ENOXAPARIN SODIUM 40 MG/0.4ML IJ SOSY
40.0000 mg | PREFILLED_SYRINGE | Freq: Every day | INTRAMUSCULAR | Status: DC
  Administered 2022-09-16: 40 mg via SUBCUTANEOUS
  Filled 2022-09-15 (×2): qty 0.4

## 2022-09-15 MED ORDER — LEVOTHYROXINE SODIUM 100 MCG PO TABS
100.0000 ug | ORAL_TABLET | Freq: Every day | ORAL | Status: DC
  Administered 2022-09-16 – 2022-09-17 (×2): 100 ug via ORAL
  Filled 2022-09-15 (×3): qty 1

## 2022-09-15 MED ORDER — ATORVASTATIN CALCIUM 40 MG PO TABS
40.0000 mg | ORAL_TABLET | Freq: Every day | ORAL | Status: DC
  Administered 2022-09-15 – 2022-09-18 (×4): 40 mg via ORAL
  Filled 2022-09-15 (×4): qty 1

## 2022-09-15 MED ORDER — ONDANSETRON HCL 4 MG/2ML IJ SOLN
INTRAMUSCULAR | Status: AC
  Filled 2022-09-15: qty 2

## 2022-09-15 NOTE — ED Notes (Signed)
[facility] called for [facility]

## 2022-09-15 NOTE — Progress Notes (Signed)
Pt admitted earlier this am by Dr Margo Aye, Please see note for details.    82 y.o. female with medical history significant for obesity, autoimmune hepatitis, OSA, recent history of DVT on Eliquis since January 2024, hypothyroidism, hypertension, hyperlipidemia, stroke, CKD 3B, who initially presented to [facility] ED due to shoulder pain and epigastric pain.  She was found to be anemic, with heme negative stools.    General exam: Appears calm and comfortable  Respiratory system: Clear to auscultation. Respiratory effort normal. Cardiovascular system: S1 & S2 heard, RRR. No JVD,  Gastrointestinal system: Abdomen is soft, tender int he epigastric area.  Central nervous system: Alert and oriented. No focal neurological deficits. Extremities: Symmetric 5 x 5 power. Skin: No rashes, lesions or ulcers Psychiatry: flat affect.    PLAN;   Elevated d dimer, epigastric pain, rule out PE. V/Q scan ordered for further eval.  CT abd and pelvis ordered by Gi.  IV PPI.    Kathlen Mody, MD

## 2022-09-15 NOTE — Progress Notes (Signed)
VASCULAR LAB    Bilateral lower extremity venous duplex has been performed.  See CV proc for preliminary results.   Jamie Hafford, RVT 09/15/2022, 11:34 AM

## 2022-09-15 NOTE — Progress Notes (Addendum)
Plan of Care Note for accepted transfer   Patient: Maria Henry MRN: [id number]   DOA: 09/14/2022  Facility requesting transfer: [facility]   Requesting Provider: Evlyn Kanner, PA   Reason for transfer: Acute on chronic anemia   Facility course: 82 yr old lady with hx of DVT on Eliquis, HTN, CKD IV, dementia, and autoimmune hepatitis who p/w chest pain and SOB. Vitals are stable, no ST elevations on EKG, no acute findings on CXR, and initial troponin normal but Hgb noted to be 6.9 (9.6 in January). She denied melena or hematochezia and had negative FOBT. Second troponin pending.   Plan of care: The patient is accepted for admission to Telemetry unit, at [facility].   Author: Briscoe Deutscher, MD 09/15/2022  Check [email or web address] for on-call coverage.  Nursing staff, Please call [facility] Admits & Consults System-Wide number on Amion as soon as patient's arrival, so appropriate admitting provider can evaluate the pt.

## 2022-09-15 NOTE — Consult Note (Addendum)
Attending physician's note   I have taken a history, reviewed the chart, and examined the patient. I performed a substantive portion of this encounter, including complete performance of at least one of the key components, in conjunction with the APP. I agree with the APP's note, impression, and recommendations with my edits.   82 year old female with medical history as outlined below, including history of autoimmune hepatitis with cirrhosis (follows with [facility]), presenting with CP, SOB, and left-sided chest pain.  Was noted to have acute on chronic iron deficiency anemia, and GI service was consulted.  FOBT was negative on arrival.  Last EGD was 11/2020 notable for small HH, fundic land polyp, but no varices.  Last colonoscopy was 04/2019 with a 4 mm benign polyp in the [facility] with plan to repeat in 5 years.  Denies any recent overt GI blood loss.  Does have known history of IDA, on oral iron as an outpatient.  Admission labs notable for following: - H/H 6.9/23, MCV/RDW 99/18 (baseline Hgb ~9-10) - Renal function just about baseline with creatinine 1.76 (baseline ~1.6/1.7) - Ferritin 5, iron 16, TIBC 339, sat 5% - Troponin normal - D-dimer 0.56 - CXR: No acute cardiopulmonary pathology - Obtaining Doppler study now  Not totally clear that her pain component is GI.  Based on her history, I am more suspicious of pulmonary etiology, including PE.  With regards to her anemia, does have history of chronic anemia (iron deficiency).  Otherwise no recent overt blood loss and heme-negative stool on arrival.  - Will eventually need EGD/colonoscopy, but unless acute bleeding, HTN stability, etc., more likely to be done as an outpatient - Posttransfusion CBC check with additional prophylaxis and per protocol - Continue Imuran for AIH cirrhosis - CT ab/pelvis - Defer which imaging study to use to rule out PE to [facility], [place], [place] [phone] office          Consultation  Referring Provider:[facility]/ Margo Aye Primary Care Physician:  [name], MD Primary Gastroenterologist:  Dr.Groh/ [facility]  Reason for Consultation:  anemia/epigastric pain  HPI: Maria Henry is a 82 y.o. female, followed by [facility] with history of cirrhosis and autoimmune hepatitis.  Also with history of sleep apnea, dementia, hypertension, prior CVA, chronic kidney disease, GERD.  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 She has been on Eliquis, had a fairly recent DVT in January 2024.  Presented to the emergency room today with complaints of chest pain and shortness of breath.  Patient tells me that the pain was located in the right side of her chest and lasted for several hours she is unable to give a good description of the pain other than saying that it was constant.  No associated nausea or vomiting, no diaphoresis.  She seems to feel better this morning but says she has not been awake long enough to tell how she feels. Initial evaluation in the emergency room with x-ray-negative Right shoulder films show osteoarthritis and acromioclavicular osteo  arthropathy  Labs-Hemoccult negative on admission WBC 7.0/hemoglobin 6.9/hematocrit 23.2/MCV 99.1/platelets 224 Troponin negative Dimer mildly elevated at 0.56 Iron 16/TIBC 339/iron sat 5/ferritin 5 Sodium 142/potassium 3.9/BUN 14/creatinine 1.61 LFTs normal  Reviewing care everywhere, she last had EGD in July 2022 with finding of a small hiatal hernia, small fundic gland polyp, no varices or GAVE  Colonoscopy 2018 with a 6 mm polyp in the sigmoid  colon, and small internal hemorrhoids.  Doing labs from care everywhere last CBC January 2024 hemoglobin 9.6/hematocrit 29.4/MCV 98  Status postcholecystectomy. Maintained on Dexilant as an outpatient for GERD.  Past Medical History:  Diagnosis Date   Arthritis    OA  AND PAIN RT KNEE, ARTHRITIS BOTH HANDS    CKD (chronic kidney disease)    "stage III" - by family   Dementia (HCC)    Depression    GERD (gastroesophageal reflux disease)    Headache(784.0)    hx of MIGRAINES   Hypothyroidism    Impaired memory    MILD    Sleep apnea    PT HAS SLEEP STUDY ABOUT 3 MONTHS AGO - ABOUT APRIL 2015 - TOLD YOU HAVE SLEEP APNEA  - BUT HAS NOT HAD 2ND PART OF THE STUDY TO BE FITTED FOR CPAP   Stroke (HCC) 8119,1478   "in right eye"--PT STATES HER VISION IS OK IN RIGHT EYE    Past Surgical History:  Procedure Laterality Date   ABDOMINAL HYSTERECTOMY  05/14/1992   BACK SURGERY     lower back   CARPAL TUNNEL RELEASE Bilateral    FOOT SURGERY Right    IR GENERIC HISTORICAL  02/21/2016   IR RADIOLOGIST EVAL & MGMT 02/21/2016 Jolaine Click, MD [facility] INTERV RAD   KNEE ARTHROSCOPY Right 07/01/2012   Procedure: RIGHT KNEE ARTHROSCOPY WITH PARTIAL MEDIAL/LATERAL MENISECTOMY;  Surgeon: Drucilla Schmidt, MD;  Location: [facility];  Service: Orthopedics;  Laterality: Right;  RIGHT KNEE ARTHROSCOPY WITH PARTIAL MEDIAL/LATERAL MENISECTOMY   PET ALZHEIMER/DEMENTIA STUDY ([facility] HX)     ROTATOR CUFF REPAIR Bilateral    x2 left, x1 right   TOE SURGERY Right 05/14/2009   "tried to straighten"   TOTAL KNEE ARTHROPLASTY Left 09/16/2012   Procedure: LEFT TOTAL KNEE ARTHROPLASTY;  Surgeon: Drucilla Schmidt, MD;  Location: [facility];  Service: Orthopedics;  Laterality: Left;   TOTAL KNEE ARTHROPLASTY Right 12/10/2013   Procedure: RIGHT TOTAL KNEE ARTHROPLASTY;  Surgeon: Jacki Cones, MD;  Location: [facility];  Service: Orthopedics;  Laterality: Right;    Prior to Admission medications   Medication Sig Start Date End Date Taking? Authorizing Provider  acetaminophen (TYLENOL) 650 MG CR tablet Take 650 mg by mouth every 8 (eight) hours as needed for pain.    [provider]  atorvastatin (LIPITOR) 40 MG tablet Take 40 mg by mouth daily.    [provider]   azaTHIOprine (IMURAN) 50 MG tablet azathioprine 50 mg tablet 10/21/17   [provider]  benzonatate (TESSALON) 100 MG capsule benzonatate 100 mg capsule    [provider]  carbidopa-levodopa (SINEMET IR) 25-100 MG tablet carbidopa 25 mg-levodopa 100 mg tablet    [provider]  cetirizine (ZYRTEC) 10 MG tablet Take 10 mg by mouth daily.    [provider]  clopidogrel (PLAVIX) 75 MG tablet Take 75 mg by mouth daily.    [provider]  desonide (DESOWEN) 0.05 % cream Apply 1 application topically 2 (two) times daily as needed.     [provider]  dexlansoprazole (DEXILANT) 60 MG capsule Dexilant 60 mg capsule, delayed release 10/31/17   [provider]  docusate sodium 100 MG CAPS Take 100 mg by mouth 2 (two) times daily. 12/11/13   Porterfield, Amber, PA-C  donepezil (ARICEPT) 10 MG tablet  10/21/17   [provider]  ferrous sulfate 325 (65 FE) MG tablet Take 1 tablet (325 mg total) by mouth 3 (three)  times daily after meals. 12/11/13   Porterfield, Amber, PA-C  fluocinonide (LIDEX) 0.05 % external solution Apply 1 application topically 2 (two) times daily as needed (itching).     [provider]  hyoscyamine (LEVSIN SL) 0.125 MG SL tablet hyoscyamine 0.125 mg sublingual tablet 07/26/17   [provider]  hyoscyamine (LEVSIN) 0.125 MG/5ML ELIX Take 0.125 mg by mouth every 4 (four) hours as needed.    [provider]  ibuprofen (ADVIL,MOTRIN) 800 MG tablet Take 1 tablet (800 mg total) by mouth 3 (three) times daily. 01/15/15   Eber Hong, MD  ketoconazole (NIZORAL) 2 % cream Apply 1 application topically daily as needed for irritation.    [provider]  ketoconazole (NIZORAL) 2 % shampoo Apply topically 2 (two) times a week.    [provider]  levETIRAcetam (KEPPRA) 500 MG tablet Take 500 mg by mouth 2 (two) times daily.    [provider]  levothyroxine (SYNTHROID,  LEVOTHROID) 88 MCG tablet Take 88 mcg by mouth daily before breakfast.    [provider]  LORazepam (ATIVAN) 1 MG tablet Take 2 mg by mouth at bedtime as needed for anxiety (sleep). For sleep    [provider]  methocarbamol (ROBAXIN) 500 MG tablet Take 1 tablet (500 mg total) by mouth every 6 (six) hours as needed for muscle spasms. 12/11/13   Porterfield, Amber, PA-C  multivitamin-iron-minerals-folic acid (THERAPEUTIC-M) TABS tablet Take 1 tablet by mouth daily.    [provider]  naftifine (NAFTIN) 1 % cream Apply 1 application topically daily as needed (for rash).    [provider]  nortriptyline (PAMELOR) 50 MG capsule Take 50 mg by mouth at bedtime.    [provider]  omeprazole (PRILOSEC OTC) 20 MG tablet Take 20 mg by mouth daily. TAKES IN AM    [provider]  pantoprazole (PROTONIX) 20 MG tablet Take 20 mg by mouth daily.    [provider]  PARoxetine (PAXIL) 30 MG tablet Take 30 mg by mouth every morning.    [provider]  rivaroxaban (XARELTO) 10 MG TABS tablet Take 1 tablet (10 mg total) by mouth daily with breakfast. 12/11/13   Porterfield, Amber, PA-C  rosuvastatin (CRESTOR) 40 MG tablet Take 40 mg by mouth daily. EVERY AM    [provider]  traMADol (ULTRAM) 50 MG tablet Take 1-2 tablets (50-100 mg total) by mouth every 6 (six) hours as needed for moderate pain. 12/11/13   Porterfield, Amber, PA-C  trospium (SANCTURA) 20 MG tablet Take 20 mg by mouth 2 (two) times daily.    [provider]  zolpidem (AMBIEN) 10 MG tablet Take 10 mg by mouth at bedtime as needed for sleep.    [provider]    Current Facility-Administered Medications  Medication Dose Route Frequency Provider Last Rate Last Admin   atorvastatin (LIPITOR) tablet 40 mg  40 mg Oral Daily Hall, Carole N, DO       enoxaparin (LOVENOX) injection 40 mg  40 mg Subcutaneous Daily Hall, Carole N, DO       lactated  ringers infusion   Intravenous Continuous Darlin Drop, DO 50 mL/hr at 09/15/22 0824 New Bag at 09/15/22 0824   levothyroxine (SYNTHROID) tablet 100 mcg  100 mcg Oral QAC breakfast Dow Adolph N, DO       pantoprazole (PROTONIX) injection 40 mg  40 mg Intravenous BID Dow Adolph N, DO   40 mg at 09/15/22 [phone]  Allergies as of 09/14/2022 - Review Complete 09/14/2022  Allergen Reaction Noted   Morphine and related  12/04/2013   Oxycodone  12/04/2013    History reviewed. No pertinent family history.  Social History   Socioeconomic History   Marital status: Single    Spouse name: Not on file   Number of children: Not on file   Years of education: Not on file   Highest education level: Not on file  Occupational History   Not on file  Tobacco Use   Smoking status: Never   Smokeless tobacco: Never  Substance and Sexual Activity   Alcohol use: No   Drug use: No   Sexual activity: Not on file  Other Topics Concern   Not on file  Social History Narrative   Not on file   Social Determinants of Health   Financial Resource Strain: Not on file  Food Insecurity: No Food Insecurity (09/15/2022)   Hunger Vital Sign    Worried About Running Out of Food in the Last Year: Never true    Ran Out of Food in the Last Year: Never true  Transportation Needs: No Transportation Needs (09/15/2022)   PRAPARE - Administrator, Civil Service (Medical): No    Lack of Transportation (Non-Medical): No  Physical Activity: Not on file  Stress: Not on file  Social Connections: Not on file  Intimate Partner Violence: Not At Risk (09/15/2022)   Humiliation, Afraid, Rape, and Kick questionnaire    Fear of Current or Ex-Partner: No    Emotionally Abused: No    Physically Abused: No    Sexually Abused: No    Review of Systems: Pertinent positive and negative review of systems were noted in the above HPI section.  All other review of systems was otherwise negative.   Physical Exam: Vital  signs in last 24 hours: Temp:  [97.9 F (36.6 C)-98.2 F (36.8 C)] 98 F (36.7 C) (05/04 0827) Pulse Rate:  [66-79] 66 (05/04 0827) Resp:  [13-28] 14 (05/04 0827) BP: (127-164)/(44-64) 128/45 (05/04 0827) SpO2:  [92 %-100 %] 94 % (05/04 0827) Weight:  [70.3 kg-76.3 kg] 76.3 kg (05/04 0236) Last BM Date : 09/14/22 General:   Alert,  Well-developed, elderly white female, pleasant and cooperative in NAD Head:  Normocephalic and atraumatic. Eyes:  Sclera clear, no icterus.   Conjunctiva pale Ears:  Normal auditory acuity. Nose:  No deformity, discharge,  or lesions. Mouth:  No deformity or lesions.   Neck:  Supple; no masses or thyromegaly. Lungs:  Clear throughout to auscultation.   No wheezes, crackles, or rhonchi. No wall tenderness to palpation  heart:  Regular rate and rhythm; no murmurs, clicks, rubs,  or gallops. Abdomen:  Soft, there is some tenderness across the upper abdomen, no guarding or rebound, no appreciable fluid wave ,BS active,nonpalp mass or hsm.   Rectal:  Deferred  Msk:  Symmetrical without gross deformities. . Pulses:  Normal pulses noted. Extremities:  Without clubbing or edema. Neurologic:  Alert and  oriented x4;  grossly normal neurologically. Skin:  Intact without significant lesions or rashes.. Psych:  Alert and cooperative. Normal mood and affect.  Intake/Output from previous day: No intake/output data recorded. Intake/Output this shift: No intake/output data recorded.  Lab Results: Recent Labs    09/14/22 2125  WBC 7.0  HGB 6.9*  HCT 23.2*  PLT 224   BMET Recent Labs    09/14/22 2125  NA 142  K 4.0  CL 116*  CO2  19*  GLUCOSE 99  BUN 18  CREATININE 1.76*  CALCIUM 8.5*   LFT No results for input(s): "PROT", "ALBUMIN", "AST", "ALT", "ALKPHOS", "BILITOT", "BILIDIR", "IBILI" in the last 72 hours. PT/INR No results for input(s): "LABPROT", "INR" in the last 72 hours. Hepatitis Panel No results for input(s): "HEPBSAG", "HCVAB",  "HEPAIGM", "HEPBIGM" in the last 72 hours.   IMPRESSION:  #62 82 year old white female with history of cirrhosis secondary to autoimmune hepatitis ( on Imuran ), chronic kidney disease, chronic GERD, prior history of CVA, mild dementia, sleep apnea and history of hypertension who is anticoagulated on Eliquis after DVT in January 2024.  Also on Plavix ? She presented to the emergency room yesterday with complaints of right-sided chest pain nonradiating and shortness of breath.  Chest x-ray and EKG negative, D-dimer slightly elevated Noted to be anemic on admission with hemoglobin 6.9, though heme-negative.  Etiology of chest pain and shortness of breath is not clear, question component of demand ischemia Query need to rule out PE   patient is tender across the upper abdomen though has no complaints of abdominal pain and still states that her pain which brought her to the emergency room was located in the right side of her chest.  #2 normocytic anemia, heme negative, iron deficient and on chronic oral iron No drift in hemoglobin from 9.19 May 2022 -6.9 on admission  No evidence for any current GI bleeding.  She may have intermittent chronic GI blood loss, she does have history of cirrhosis but last EGD July 2022 showed no evidence of varices or portal gastropathy.  Colonoscopy 2018 with removal of 1 small polyp, noted internal hemorrhoids  -rule out portal gastropathy, AVMs, occult neoplasm  #3 recent DVT-on Eliquis #4 dementia #5.  Prior CVA #6.  Sleep apnea #7.  Chronic kidney disease #8 status post cholecystectomy   PLAN: Twice daily PPI Pt  has been transfused 1 unit of packed RBCs, trend hemoglobin Check INR, check AFP Continue Imuran Will schedule for CT of the abdomen and pelvis no IV contrast Defer to medicine service regarding CT angio chest Consider IV iron infusions during this admission She will need EGD and possibly colonoscopy eventually, not necessarily inpatient  as currently heme-negative.   Amy Esterwood PA-C 09/15/2022, 8:42 AM

## 2022-09-15 NOTE — H&P (Addendum)
History and Physical  [name] HYQ:[id number] DOB: 02-03-1941 DOA: 09/14/2022  Referring physician: Accepted by Dr. Sherrell Puller, hospitalist service. PCP: [name], MD  Outpatient Specialists: GI, nephrology, neurology, cardiology. Patient coming from: Home through [facility] ED  Chief Complaint: Shoulders pain, epigastric pain  HPI: Maria Henry is a 82 y.o. female with medical history significant for obesity, autoimmune hepatitis, OSA, recent history of DVT on Eliquis since January 2024, hypothyroidism, hypertension, hyperlipidemia, stroke, CKD 3B, who initially presented to [facility] ED due to shoulder pain and epigastric pain.  Denies any trauma or overt bleeding.  While she was in the ED lab studies returned remarkable for acute blood loss anemia with hemoglobin of 6.9 from baseline hemoglobin of 8.  The patient denies hematochezia or melena.  FOBT done in the ED was negative.  Additionally endorses feeling short of breath with exertion.  Due to concern for symptomatic anemia, EDP requested admission for further evaluation and management.  The patient was accepted by Dr. Antionette Char, [facility], and transferred to [facility] telemetry cardiac unit as observation status.  At the time of this visit the patient reports significant epigastric pain and tenderness with palpation.  Also endorses shoulders pain.  Iron studies and 1 unit of PRBCs ordered to be transfused for hemoglobin of 6.9.  ED Course: Tmax 98.1.  BP 152/63, pulse 74, respiratory 19, saturation 98% on room air.  Lab studies markable for hemoglobin 6.9, WBC 7.0, platelet count 224.  Serum bicarb 19, creatinine 1.76 with GFR of 29, troponin 7 repeat 7.  Baseline creatinine 1.5 with GFR of 33.  Review of Systems: Review of systems as noted in the HPI. All other systems reviewed and are negative.   Past Medical History:  Diagnosis Date   Arthritis    OA AND PAIN RT KNEE, ARTHRITIS BOTH HANDS    CKD (chronic kidney disease)    "stage  III" - by family   Dementia (HCC)    Depression    GERD (gastroesophageal reflux disease)    Headache(784.0)    hx of MIGRAINES   Hypothyroidism    Impaired memory    MILD    Sleep apnea    PT HAS SLEEP STUDY ABOUT 3 MONTHS AGO - ABOUT APRIL 2015 - TOLD YOU HAVE SLEEP APNEA  - BUT HAS NOT HAD 2ND PART OF THE STUDY TO BE FITTED FOR CPAP   Stroke (HCC) 9528,4132   "in right eye"--PT STATES HER VISION IS OK IN RIGHT EYE   Past Surgical History:  Procedure Laterality Date   ABDOMINAL HYSTERECTOMY  05/14/1992   BACK SURGERY     lower back   CARPAL TUNNEL RELEASE Bilateral    FOOT SURGERY Right    IR GENERIC HISTORICAL  02/21/2016   IR RADIOLOGIST EVAL & MGMT 02/21/2016 Jolaine Click, MD [facility] INTERV RAD   KNEE ARTHROSCOPY Right 07/01/2012   Procedure: RIGHT KNEE ARTHROSCOPY WITH PARTIAL MEDIAL/LATERAL MENISECTOMY;  Surgeon: Drucilla Schmidt, MD;  Location: [facility] ORS;  Service: Orthopedics;  Laterality: Right;  RIGHT KNEE ARTHROSCOPY WITH PARTIAL MEDIAL/LATERAL MENISECTOMY   PET ALZHEIMER/DEMENTIA STUDY ([facility] HX)     ROTATOR CUFF REPAIR Bilateral    x2 left, x1 right   TOE SURGERY Right 05/14/2009   "tried to straighten"   TOTAL KNEE ARTHROPLASTY Left 09/16/2012   Procedure: LEFT TOTAL KNEE ARTHROPLASTY;  Surgeon: Drucilla Schmidt, MD;  Location: [facility] ORS;  Service: Orthopedics;  Laterality: Left;   TOTAL KNEE ARTHROPLASTY Right 12/10/2013  Procedure: RIGHT TOTAL KNEE ARTHROPLASTY;  Surgeon: Jacki Cones, MD;  Location: [facility] ORS;  Service: Orthopedics;  Laterality: Right;    Social History:  reports that she has never smoked. She has never used smokeless tobacco. She reports that she does not drink alcohol and does not use drugs.   Allergies  Allergen Reactions   Morphine And Related     CAUSED NAUSEA AND HALLUCINATIONS   Oxycodone     PT STATES OXYCODONE MAKES HER VERY SLEEPY   Past family history: None reported.  Prior to Admission medications   Medication Sig Start Date  End Date Taking? Authorizing Provider  acetaminophen (TYLENOL) 650 MG CR tablet Take 650 mg by mouth every 8 (eight) hours as needed for pain.    [provider]  atorvastatin (LIPITOR) 40 MG tablet Take 40 mg by mouth daily.    [provider]  azaTHIOprine (IMURAN) 50 MG tablet azathioprine 50 mg tablet 10/21/17   [provider]  benzonatate (TESSALON) 100 MG capsule benzonatate 100 mg capsule    [provider]  carbidopa-levodopa (SINEMET IR) 25-100 MG tablet carbidopa 25 mg-levodopa 100 mg tablet    [provider]  cetirizine (ZYRTEC) 10 MG tablet Take 10 mg by mouth daily.    [provider]  clopidogrel (PLAVIX) 75 MG tablet Take 75 mg by mouth daily.    [provider]  desonide (DESOWEN) 0.05 % cream Apply 1 application topically 2 (two) times daily as needed.     [provider]  dexlansoprazole (DEXILANT) 60 MG capsule Dexilant 60 mg capsule, delayed release 10/31/17   [provider]  docusate sodium 100 MG CAPS Take 100 mg by mouth 2 (two) times daily. 12/11/13   Porterfield, Amber, PA-C  donepezil (ARICEPT) 10 MG tablet  10/21/17   [provider]  ferrous sulfate 325 (65 FE) MG tablet Take 1 tablet (325 mg total) by mouth 3 (three) times daily after meals. 12/11/13   Porterfield, Amber, PA-C  fluocinonide (LIDEX) 0.05 % external solution Apply 1 application topically 2 (two) times daily as needed (itching).     [provider]  hyoscyamine (LEVSIN SL) 0.125 MG SL tablet hyoscyamine 0.125 mg sublingual tablet 07/26/17   [provider]  hyoscyamine (LEVSIN) 0.125 MG/5ML ELIX Take 0.125 mg by mouth every 4 (four) hours as needed.    [provider]  ibuprofen (ADVIL,MOTRIN) 800 MG tablet Take 1 tablet (800 mg total) by mouth 3 (three) times daily. 01/15/15   Eber Hong, MD  ketoconazole (NIZORAL) 2 % cream Apply 1 application topically daily as needed for irritation.     [provider]  ketoconazole (NIZORAL) 2 % shampoo Apply topically 2 (two) times a week.    [provider]  levETIRAcetam (KEPPRA) 500 MG tablet Take 500 mg by mouth 2 (two) times daily.    [provider]  levothyroxine (SYNTHROID, LEVOTHROID) 88 MCG tablet Take 88 mcg by mouth daily before breakfast.    [provider]  LORazepam (ATIVAN) 1 MG tablet Take 2 mg by mouth at bedtime as needed for anxiety (sleep). For sleep    [provider]  methocarbamol (ROBAXIN) 500 MG tablet Take 1 tablet (500 mg total) by mouth every 6 (six) hours as needed for muscle spasms. 12/11/13   Porterfield, Amber, PA-C  multivitamin-iron-minerals-folic acid (THERAPEUTIC-M) TABS tablet Take 1 tablet by mouth daily.    [provider]  naftifine (NAFTIN) 1 % cream Apply 1 application topically  daily as needed (for rash).    [provider]  nortriptyline (PAMELOR) 50 MG capsule Take 50 mg by mouth at bedtime.    [provider]  omeprazole (PRILOSEC OTC) 20 MG tablet Take 20 mg by mouth daily. TAKES IN AM    [provider]  pantoprazole (PROTONIX) 20 MG tablet Take 20 mg by mouth daily.    [provider]  PARoxetine (PAXIL) 30 MG tablet Take 30 mg by mouth every morning.    [provider]  rivaroxaban (XARELTO) 10 MG TABS tablet Take 1 tablet (10 mg total) by mouth daily with breakfast. 12/11/13   Porterfield, Amber, PA-C  rosuvastatin (CRESTOR) 40 MG tablet Take 40 mg by mouth daily. EVERY AM    [provider]  traMADol (ULTRAM) 50 MG tablet Take 1-2 tablets (50-100 mg total) by mouth every 6 (six) hours as needed for moderate pain. 12/11/13   Porterfield, Amber, PA-C  trospium (SANCTURA) 20 MG tablet Take 20 mg by mouth 2 (two) times daily.    [provider]  zolpidem (AMBIEN) 10 MG tablet Take 10 mg by mouth at bedtime as needed for sleep.    [provider]    Physical Exam: BP (!)  142/55 (BP Location: Right Arm)   Pulse 74   Temp 98.2 F (36.8 C) (Oral)   Resp 19   Ht 5\' 2"  (1.575 m)   Wt 76.3 kg   SpO2 100%   BMI 30.77 kg/m   General: 82 y.o. year-old female well developed well nourished in no acute distress.  Alert and oriented x3. Cardiovascular: Regular rate and rhythm with no rubs or gallops.  No thyromegaly or JVD noted.  No lower extremity edema. 2/4 pulses in all 4 extremities. Respiratory: Clear to auscultation with no wheezes or rales. Good inspiratory effort. Abdomen: Soft, severe epigastric tenderness with minimal palpation.  Nondistended with normal bowel sounds x4 quadrants. Muskuloskeletal: No cyanosis, clubbing or edema noted bilaterally Neuro: CN II-XII intact, strength, sensation, reflexes Skin: No ulcerative lesions noted or rashes Psychiatry: Judgement and insight appear normal. Mood is appropriate for condition and setting          Labs on Admission:  Basic Metabolic Panel: Recent Labs  Lab 09/14/22 2125  NA 142  K 4.0  CL 116*  CO2 19*  GLUCOSE 99  BUN 18  CREATININE 1.76*  CALCIUM 8.5*   Liver Function Tests: No results for input(s): "AST", "ALT", "ALKPHOS", "BILITOT", "PROT", "ALBUMIN" in the last 168 hours. No results for input(s): "LIPASE", "AMYLASE" in the last 168 hours. No results for input(s): "AMMONIA" in the last 168 hours. CBC: Recent Labs  Lab 09/14/22 2125  WBC 7.0  HGB 6.9*  HCT 23.2*  MCV 99.1  PLT 224   Cardiac Enzymes: No results for input(s): "CKTOTAL", "CKMB", "CKMBINDEX", "TROPONINI" in the last 168 hours.  BNP (last 3 results) No results for input(s): "BNP" in the last 8760 hours.  ProBNP (last 3 results) No results for input(s): "PROBNP" in the last 8760 hours.  CBG: No results for input(s): "GLUCAP" in the last 168 hours.  Radiological Exams on Admission: DG Chest Port 1 View  Result Date: 09/14/2022 CLINICAL DATA:  Chest pain, right hand and shoulder pain EXAM: PORTABLE CHEST 1 VIEW  COMPARISON:  01/15/2015 FINDINGS: Single frontal view of the chest demonstrates a stable cardiac silhouette. No airspace disease, effusion, or pneumothorax. No acute bony abnormalities. IMPRESSION: 1. No acute intrathoracic process. Electronically Signed   By:  Sharlet Salina M.D.   On: 09/14/2022 22:49   DG Shoulder Right  Result Date: 09/14/2022 CLINICAL DATA:  Right shoulder pain, pain with movement EXAM: RIGHT SHOULDER - 2+ VIEW COMPARISON:  None Available. FINDINGS: Internal rotation, external rotation, and transscapular views of the right shoulder are obtained. No acute displaced fracture, subluxation, or dislocation. Moderate hypertrophic changes are seen of the acromioclavicular joint. Mild spurring of the undersurface of the acromion process. Mild glenohumeral joint osteoarthritis. Soft tissues are unremarkable. Right chest is clear. IMPRESSION: 1. Moderate acromioclavicular joint osteoarthropathy. 2. Mild glenohumeral joint osteoarthritis. 3. No acute displaced fracture. Electronically Signed   By: Sharlet Salina M.D.   On: 09/14/2022 18:35    EKG: I independently viewed the EKG done and my findings are as followed: Sinus rhythm rate of 82.  Nonspecific ST-T changes.  QTc 471.  Assessment/Plan Present on Admission:  Acute on chronic anemia  Principal Problem:   Acute on chronic anemia  Acute on chronic normocytic anemia. Concern for possible chronic blood loss anemia, rule out GI causes. Baseline hemoglobin 8.2, presented with hemoglobin of 6.9. Negative FOBT in the ED Denies hematochezia or melena She is unsure about NSAID use, somebody else manages her medications. Follow iron studies 1 unit PRBCs ordered to be transfused for hemoglobin of 6.9. Repeat CBC post blood transfusion.  Severe epigastric pain and tenderness, unclear etiology Started IV PPI twice daily [facility], Dr. Norva Pavlov consulted to further assess N.p.o. until seen by GI. As needed analgesics  Non anion gap  metabolic acidosis Presented with serum bicarb of 19 and anion gap of 7 Gentle IV fluid hydration LR 50 cc/h x 1 day.  CKD 3B Appears to be close to her baseline creatinine Closely monitor urine output Avoid nephrotoxic agents, dehydration and hypotension.  Back pain, shoulder pain Denies trauma Symptomatic management  Recent history of DVT Home Eliquis held due to concern for upper GI bleed Obtain bilateral lower extremity Doppler ultrasound to rule out DVT Defer to GI to resume home Eliquis.  Hypothyroidism Resume home levothyroxine  Hyperlipidemia Resume home Lipitor  GERD PPI twice daily   DVT prophylaxis: Subcu Lovenox daily.  Full dose home oral anticoagulation, DOAC, Eliquis, held due to concern for possible upper GI bleed.  Defer to GI to resume home Eliquis  Code Status: Full code as stated by the patient herself.  Family Communication: None at bedside.  Disposition Plan: Admitted to telemetry cardiac unit.  Consults called: GI consulted.  Admission status: Observation status   Status is: Observation    Darlin Drop MD [facility] Pager [phone]  If 7PM-7AM, please contact night-coverage [email or web address] Password [email or web address]  09/15/2022, 3:13 AM

## 2022-09-16 ENCOUNTER — Observation Stay (HOSPITAL_COMMUNITY): Payer: Medicare PPO

## 2022-09-16 ENCOUNTER — Other Ambulatory Visit: Payer: Self-pay

## 2022-09-16 DIAGNOSIS — I8510 Secondary esophageal varices without bleeding: Secondary | ICD-10-CM | POA: Diagnosis present

## 2022-09-16 DIAGNOSIS — R1013 Epigastric pain: Secondary | ICD-10-CM | POA: Diagnosis present

## 2022-09-16 DIAGNOSIS — K754 Autoimmune hepatitis: Secondary | ICD-10-CM

## 2022-09-16 DIAGNOSIS — E669 Obesity, unspecified: Secondary | ICD-10-CM | POA: Diagnosis present

## 2022-09-16 DIAGNOSIS — I129 Hypertensive chronic kidney disease with stage 1 through stage 4 chronic kidney disease, or unspecified chronic kidney disease: Secondary | ICD-10-CM | POA: Diagnosis present

## 2022-09-16 DIAGNOSIS — G4733 Obstructive sleep apnea (adult) (pediatric): Secondary | ICD-10-CM | POA: Diagnosis present

## 2022-09-16 DIAGNOSIS — F0390 Unspecified dementia without behavioral disturbance: Secondary | ICD-10-CM | POA: Diagnosis present

## 2022-09-16 DIAGNOSIS — Z86718 Personal history of other venous thrombosis and embolism: Secondary | ICD-10-CM | POA: Diagnosis not present

## 2022-09-16 DIAGNOSIS — N189 Chronic kidney disease, unspecified: Secondary | ICD-10-CM | POA: Diagnosis not present

## 2022-09-16 DIAGNOSIS — L8990 Pressure ulcer of unspecified site, unspecified stage: Secondary | ICD-10-CM | POA: Insufficient documentation

## 2022-09-16 DIAGNOSIS — Z96653 Presence of artificial knee joint, bilateral: Secondary | ICD-10-CM | POA: Diagnosis present

## 2022-09-16 DIAGNOSIS — K3189 Other diseases of stomach and duodenum: Secondary | ICD-10-CM | POA: Diagnosis present

## 2022-09-16 DIAGNOSIS — K7460 Unspecified cirrhosis of liver: Secondary | ICD-10-CM | POA: Diagnosis not present

## 2022-09-16 DIAGNOSIS — E039 Hypothyroidism, unspecified: Secondary | ICD-10-CM | POA: Diagnosis present

## 2022-09-16 DIAGNOSIS — E872 Acidosis, unspecified: Secondary | ICD-10-CM | POA: Diagnosis present

## 2022-09-16 DIAGNOSIS — D649 Anemia, unspecified: Secondary | ICD-10-CM | POA: Diagnosis present

## 2022-09-16 DIAGNOSIS — E785 Hyperlipidemia, unspecified: Secondary | ICD-10-CM | POA: Diagnosis present

## 2022-09-16 DIAGNOSIS — K317 Polyp of stomach and duodenum: Secondary | ICD-10-CM | POA: Diagnosis present

## 2022-09-16 DIAGNOSIS — K2970 Gastritis, unspecified, without bleeding: Secondary | ICD-10-CM | POA: Diagnosis present

## 2022-09-16 DIAGNOSIS — K7469 Other cirrhosis of liver: Secondary | ICD-10-CM | POA: Diagnosis present

## 2022-09-16 DIAGNOSIS — Z6830 Body mass index (BMI) 30.0-30.9, adult: Secondary | ICD-10-CM | POA: Diagnosis not present

## 2022-09-16 DIAGNOSIS — D509 Iron deficiency anemia, unspecified: Secondary | ICD-10-CM | POA: Diagnosis present

## 2022-09-16 DIAGNOSIS — L89151 Pressure ulcer of sacral region, stage 1: Secondary | ICD-10-CM | POA: Diagnosis present

## 2022-09-16 DIAGNOSIS — Z8673 Personal history of transient ischemic attack (TIA), and cerebral infarction without residual deficits: Secondary | ICD-10-CM | POA: Diagnosis not present

## 2022-09-16 DIAGNOSIS — D62 Acute posthemorrhagic anemia: Secondary | ICD-10-CM | POA: Diagnosis not present

## 2022-09-16 DIAGNOSIS — Z885 Allergy status to narcotic agent status: Secondary | ICD-10-CM | POA: Diagnosis not present

## 2022-09-16 DIAGNOSIS — G4730 Sleep apnea, unspecified: Secondary | ICD-10-CM | POA: Diagnosis not present

## 2022-09-16 DIAGNOSIS — N1832 Chronic kidney disease, stage 3b: Secondary | ICD-10-CM | POA: Diagnosis present

## 2022-09-16 DIAGNOSIS — K219 Gastro-esophageal reflux disease without esophagitis: Secondary | ICD-10-CM | POA: Diagnosis present

## 2022-09-16 DIAGNOSIS — Z79890 Hormone replacement therapy (postmenopausal): Secondary | ICD-10-CM | POA: Diagnosis not present

## 2022-09-16 LAB — CBC
HCT: 21.5 % — ABNORMAL LOW (ref 36.0–46.0)
Hemoglobin: 7 g/dL — ABNORMAL LOW (ref 12.0–15.0)
MCH: 30.3 pg (ref 26.0–34.0)
MCHC: 32.6 g/dL (ref 30.0–36.0)
MCV: 93.1 fL (ref 80.0–100.0)
Platelets: 182 10*3/uL (ref 150–400)
RBC: 2.31 MIL/uL — ABNORMAL LOW (ref 3.87–5.11)
RDW: 17 % — ABNORMAL HIGH (ref 11.5–15.5)
WBC: 4.2 10*3/uL (ref 4.0–10.5)
nRBC: 0 % (ref 0.0–0.2)

## 2022-09-16 LAB — BASIC METABOLIC PANEL
Anion gap: 8 (ref 5–15)
Anion gap: 7 (ref 5–15)
BUN: 10 mg/dL (ref 8–23)
BUN: 11 mg/dL (ref 8–23)
CO2: 18 mmol/L — ABNORMAL LOW (ref 22–32)
CO2: 18 mmol/L — ABNORMAL LOW (ref 22–32)
Calcium: 8.3 mg/dL — ABNORMAL LOW (ref 8.9–10.3)
Calcium: 8.7 mg/dL — ABNORMAL LOW (ref 8.9–10.3)
Chloride: 113 mmol/L — ABNORMAL HIGH (ref 98–111)
Chloride: 112 mmol/L — ABNORMAL HIGH (ref 98–111)
Creatinine, Ser: 1.45 mg/dL — ABNORMAL HIGH (ref 0.44–1.00)
Creatinine, Ser: 1.44 mg/dL — ABNORMAL HIGH (ref 0.44–1.00)
GFR, Estimated: 36 mL/min — ABNORMAL LOW (ref >60)
GFR, Estimated: 37 mL/min — ABNORMAL LOW (ref >60)
Glucose, Bld: 80 mg/dL (ref 70–99)
Glucose, Bld: 109 mg/dL — ABNORMAL HIGH (ref 70–99)
Potassium: 4 mmol/L (ref 3.5–5.1)
Potassium: 3.8 mmol/L (ref 3.5–5.1)
Sodium: 139 mmol/L (ref 135–145)
Sodium: 137 mmol/L (ref 135–145)

## 2022-09-16 LAB — BPAM RBC
Blood Product Expiration Date: 202406032359
Blood Product Expiration Date: 202406072359
Unit Type and Rh: 5100

## 2022-09-16 LAB — MAGNESIUM: Magnesium: 2.2 mg/dL (ref 1.7–2.4)

## 2022-09-16 LAB — PREPARE RBC (CROSSMATCH)

## 2022-09-16 MED ORDER — TRAZODONE HCL 50 MG PO TABS
50.0000 mg | ORAL_TABLET | Freq: Every evening | ORAL | Status: DC | PRN
  Administered 2022-09-16 – 2022-09-17 (×2): 50 mg via ORAL
  Filled 2022-09-16 (×2): qty 1

## 2022-09-16 MED ORDER — DONEPEZIL HCL 10 MG PO TABS
10.0000 mg | ORAL_TABLET | Freq: Every morning | ORAL | Status: DC
  Administered 2022-09-17 – 2022-09-18 (×2): 10 mg via ORAL
  Filled 2022-09-16 (×2): qty 1

## 2022-09-16 MED ORDER — PRIMIDONE 50 MG PO TABS
50.0000 mg | ORAL_TABLET | Freq: Every day | ORAL | Status: DC
  Administered 2022-09-16 – 2022-09-17 (×2): 50 mg via ORAL
  Filled 2022-09-16 (×4): qty 1

## 2022-09-16 MED ORDER — AZATHIOPRINE 50 MG PO TABS
25.0000 mg | ORAL_TABLET | Freq: Every day | ORAL | Status: DC
  Administered 2022-09-17 – 2022-09-18 (×2): 25 mg via ORAL
  Filled 2022-09-16 (×2): qty 1

## 2022-09-16 MED ORDER — NALOXONE HCL 0.4 MG/ML IJ SOLN: 0.4000 mg | INTRAMUSCULAR | Status: DC | PRN

## 2022-09-16 MED ORDER — HEPARIN (PORCINE) 25000 UT/250ML-% IV SOLN
1100.0000 [IU]/h | INTRAVENOUS | Status: DC
  Administered 2022-09-16: 1100 [IU]/h via INTRAVENOUS
  Filled 2022-09-16: qty 250

## 2022-09-16 MED ORDER — FENTANYL CITRATE PF 50 MCG/ML IJ SOSY
25.0000 ug | PREFILLED_SYRINGE | INTRAMUSCULAR | Status: DC | PRN
  Administered 2022-09-16 – 2022-09-17 (×2): 25 ug via INTRAVENOUS
  Filled 2022-09-16 (×2): qty 1

## 2022-09-16 MED ORDER — MEMANTINE HCL 5 MG PO TABS
5.0000 mg | ORAL_TABLET | Freq: Two times a day (BID) | ORAL | Status: DC
  Administered 2022-09-16 – 2022-09-18 (×4): 5 mg via ORAL
  Filled 2022-09-16 (×5): qty 1

## 2022-09-16 MED ORDER — TECHNETIUM TO 99M ALBUMIN AGGREGATED
4.4000 | Freq: Once | INTRAVENOUS | Status: AC | PRN
  Administered 2022-09-16: 4.4 via INTRAVENOUS

## 2022-09-16 MED ORDER — SODIUM CHLORIDE 0.9% IV SOLUTION: Freq: Once | INTRAVENOUS | Status: DC

## 2022-09-16 NOTE — Progress Notes (Signed)
[facility] ROUNDING NOTE   Subjective: Not complaining of any pain on exam today.  Doppler yesterday was negative.  CT abdomen/pelvis with normal-appearing GI tract without any areas of inflammation, obstruction, distention.  CT did show remote severe anterior wedge compression deformity of L1 with minimal retropulsion of vertebral body and remote superior endplate fracture of L3.  CXR this morning was without acute cardiopulmonary disease.  Has not had advanced thoracic imaging such as V/Q or CTPA.  H/H stable after 1 unit PRBC transfusion earlier yesterday. No overt bleeding.   Objective: Vital signs in last 24 hours: Temp:  [97.6 F (36.4 C)-97.9 F (36.6 C)] 97.6 F (36.4 C) (05/05 0712) Pulse Rate:  [59-68] 59 (05/05 0712) Resp:  [11-19] 11 (05/05 0712) BP: (146-149)/(48-58) 146/58 (05/05 0712) SpO2:  [94 %-98 %] 94 % (05/05 0712) Last BM Date : 09/14/22 General: NAD Lungs:  CTA b/l, no w/r/r Heart:  RRR, no m/r/g Abdomen:  Soft, NT, ND, +BS    Intake/Output from previous day: 05/04 0701 - 05/05 0700 In: 1090.1 [I.V.:756.1; Blood:300; IV Piggyback:34] Out: 4 [Urine:2; Stool:2] Intake/Output this shift: No intake/output data recorded.   Lab Results: Recent Labs    09/14/22 2125 09/15/22 1420 09/15/22 2109 09/16/22 0953  WBC 7.0 5.4  --  4.2  HGB 6.9* 7.7* 7.7* 7.0*  PLT 224 216  --  182  MCV 99.1 98.5  --  93.1   BMET Recent Labs    09/14/22 2125 09/15/22 0807 09/16/22 0953  NA 142 142 139  K 4.0 3.9 4.0  CL 116* 115* 113*  CO2 19* 18* 18*  GLUCOSE 99 114* 80  BUN 18 14 10   CREATININE 1.76* 1.61* 1.45*  CALCIUM 8.5* 8.3* 8.3*   LFT Recent Labs    09/15/22 0807  PROT 5.8*  ALBUMIN 3.2*  AST 15  ALT 11  ALKPHOS 67  BILITOT 0.5   PT/INR Recent Labs    09/15/22 1420  INR 1.3*      Imaging/Other results: DG CHEST PORT 1 VIEW  Result Date: 09/16/2022 CLINICAL DATA:  Epigastric pain. EXAM: PORTABLE CHEST 1 VIEW  COMPARISON:  09/14/2022 and older exams. FINDINGS: Cardiac silhouette is normal in size. Bilateral hilar prominence, right greater than left, is presumed to be vascular accentuated by the semi-erect technique. No convincing mediastinal masses. Clear lungs.  No pleural effusion or pneumothorax. Skeletal structures are grossly intact. IMPRESSION: No active disease. Electronically Signed   By: Amie Portland M.D.   On: 09/16/2022 08:07   CT ABDOMEN PELVIS WO CONTRAST  Result Date: 09/15/2022 CLINICAL DATA:  Epigastric abdominal pain EXAM: CT ABDOMEN AND PELVIS WITHOUT CONTRAST TECHNIQUE: Multidetector CT imaging of the abdomen and pelvis was performed following the standard protocol without IV contrast. RADIATION DOSE REDUCTION: This exam was performed according to the departmental dose-optimization program which includes automated exposure control, adjustment of the mA and/or kV according to patient size and/or use of iterative reconstruction technique. COMPARISON:  None Available. FINDINGS: Lower chest: No acute abnormality. Hepatobiliary: No focal liver abnormality is seen. Status post cholecystectomy. No biliary dilatation. Pancreas: Unremarkable Spleen: Unremarkable Adrenals/Urinary Tract: The adrenal glands are unremarkable. The kidneys are normal in position. Moderate bilateral renal cortical atrophy. And exophytic 20 mm lesion is seen arising from the interpolar region of the right kidney that was better assessed on prior sonogram of 07/26/2021 and is compatible with a hyperdense cortical cyst. Smaller simple cortical cyst arises from the upper pole the right kidney. No follow-up imaging  is recommended for these lesions. The kidneys are otherwise unremarkable. The bladder is unremarkable. Stomach/Bowel: Stomach is within normal limits. Appendix appears normal. No evidence of bowel wall thickening, distention, or inflammatory changes. Vascular/Lymphatic: Aortic atherosclerosis. No enlarged abdominal or pelvic  lymph nodes. Reproductive: Status post hysterectomy. No adnexal masses. Other: No abdominal wall hernia or abnormality. No abdominopelvic ascites. Musculoskeletal: Remote healed fracture deformity of the left ischium. Remote severe anterior wedge compression deformity of L1 with 80% loss of height and minimal retropulsion of the posterosuperior aspect of the vertebral body by 3 mm. Remote superior endplate fracture of L3. Degenerative changes are seen within the lumbar spine. Bilateral laminectomy and posterior decompression of L5-S1. IMPRESSION: 1. No acute intra-abdominal pathology identified. No definite radiographic explanation for the patient's reported symptoms. 2. Moderate bilateral renal cortical atrophy. 3. Remote severe anterior wedge compression deformity of L1 with 80% loss of height and minimal retropulsion of the posterosuperior aspect of the vertebral body by 3 mm. Remote superior endplate fracture of L3. Remote healed fracture deformity of the left ischium. No acute bone abnormality. Aortic Atherosclerosis (ICD10-I70.0). Electronically Signed   By: Helyn Numbers M.D.   On: 09/15/2022 21:17   VAS [place] LOWER EXTREMITY VENOUS (DVT)  Result Date: 09/15/2022  Lower Venous DVT Study Patient Name:  Maria Henry  Date of Exam:   09/15/2022 Medical Rec #: [id number]        Accession #:    [id number] Date of Birth: 02/13/1941        Patient Gender: F Patient Age:   19 years Exam Location:  [facility] Procedure:      VAS [place] LOWER EXTREMITY VENOUS (DVT) Referring Phys: Enid Derry HALL --------------------------------------------------------------------------------  Indications: Elevated D-Dimer, and Pain.  Limitations: Body habitus and Edema. Comparison Study: No prior study on file Performing Technologist: Sherren Kerns RVS  Examination Guidelines: A complete evaluation includes B-mode imaging, spectral Doppler, color Doppler, and power Doppler as needed of all accessible portions of each vessel.  Bilateral testing is considered an integral part of a complete examination. Limited examinations for reoccurring indications may be performed as noted. The reflux portion of the exam is performed with the patient in reverse Trendelenburg.  +---------+---------------+---------+-----------+----------+-------------------+ RIGHT    CompressibilityPhasicitySpontaneityPropertiesThrombus Aging      +---------+---------------+---------+-----------+----------+-------------------+ CFV      Full           Yes      Yes                                      +---------+---------------+---------+-----------+----------+-------------------+ SFJ      Full                                                             +---------+---------------+---------+-----------+----------+-------------------+ FV Prox  Full                                                             +---------+---------------+---------+-----------+----------+-------------------+ FV Mid   Full  Yes      Yes                                      +---------+---------------+---------+-----------+----------+-------------------+ FV DistalFull                                                             +---------+---------------+---------+-----------+----------+-------------------+ PFV      Full                                                             +---------+---------------+---------+-----------+----------+-------------------+ POP      Full           Yes      Yes                                      +---------+---------------+---------+-----------+----------+-------------------+ PTV                                                   Not well visualized +---------+---------------+---------+-----------+----------+-------------------+ PERO                                                  Not well visualized +---------+---------------+---------+-----------+----------+-------------------+    +---------+---------------+---------+-----------+----------+-------------------+ LEFT     CompressibilityPhasicitySpontaneityPropertiesThrombus Aging      +---------+---------------+---------+-----------+----------+-------------------+ CFV      Full           Yes      Yes                                      +---------+---------------+---------+-----------+----------+-------------------+ SFJ      Full                                                             +---------+---------------+---------+-----------+----------+-------------------+ FV Prox  Full                                                             +---------+---------------+---------+-----------+----------+-------------------+ FV Mid   Full           Yes      Yes                                      +---------+---------------+---------+-----------+----------+-------------------+  FV DistalFull                                                             +---------+---------------+---------+-----------+----------+-------------------+ PFV      Full                                                             +---------+---------------+---------+-----------+----------+-------------------+ POP      Full           Yes      Yes                                      +---------+---------------+---------+-----------+----------+-------------------+ PTV                                                   Not well visualized +---------+---------------+---------+-----------+----------+-------------------+ PERO                                                  Not well visualized +---------+---------------+---------+-----------+----------+-------------------+     Summary: RIGHT: - There is no evidence of deep vein thrombosis in the lower extremity. However, portions of this examination were limited- see technologist comments above.  - No cystic structure found in the popliteal fossa.  LEFT: - There is  no evidence of deep vein thrombosis in the lower extremity. However, portions of this examination were limited- see technologist comments above.  - No cystic structure found in the popliteal fossa.  *See table(s) above for measurements and observations. Electronically signed by Coral Else MD on 09/15/2022 at 3:04:49 PM.    Final    DG Chest Port 1 View  Result Date: 09/14/2022 CLINICAL DATA:  Chest pain, right hand and shoulder pain EXAM: PORTABLE CHEST 1 VIEW COMPARISON:  01/15/2015 FINDINGS: Single frontal view of the chest demonstrates a stable cardiac silhouette. No airspace disease, effusion, or pneumothorax. No acute bony abnormalities. IMPRESSION: 1. No acute intrathoracic process. Electronically Signed   By: Sharlet Salina M.D.   On: 09/14/2022 22:49   DG Shoulder Right  Result Date: 09/14/2022 CLINICAL DATA:  Right shoulder pain, pain with movement EXAM: RIGHT SHOULDER - 2+ VIEW COMPARISON:  None Available. FINDINGS: Internal rotation, external rotation, and transscapular views of the right shoulder are obtained. No acute displaced fracture, subluxation, or dislocation. Moderate hypertrophic changes are seen of the acromioclavicular joint. Mild spurring of the undersurface of the acromion process. Mild glenohumeral joint osteoarthritis. Soft tissues are unremarkable. Right chest is clear. IMPRESSION: 1. Moderate acromioclavicular joint osteoarthropathy. 2. Mild glenohumeral joint osteoarthritis. 3. No acute displaced fracture. Electronically Signed   By: Sharlet Salina M.D.   On: 09/14/2022 18:35      Assessment and Plan:  82 year old female with history of autoimmune hepatitis with  cirrhosis (follows with [facility]), presenting with CP, SOB, and left-sided chest pain.  Was noted to have acute on chronic iron deficiency anemia, prompting GI consult.  No overt bleeding and heme-negative stool on arrival.  Transfused 1 unit RBCs on 09/15/2022.  1) Acute on chronic anemia 2) Iron deficiency  anemia Known history of IDA, on chronic oral iron prior to admission. Hgb 6.9 on admission (baseline Hgb ~9-10), with good serologic response to 1 unit PRBC transfusion. - Continue serial CBC checks - Will eventually need EGD/colonoscopy, but can likely be deferred to outpatient as she is without overt bleeding, heme-negative stool, and known IDA. - Please give IV iron during this admission  3) Autoimmune hepatitis with cirrhosis - Continue Imuran - Follows with [facility]atology  4) Elevated D-dimer 5) Recent DVT - LE Dopplers were negative - Defer additional imaging (i.e. V/Q, CTPA) to primary Hospital service - Was on Eliquis prior to arrival.  Holding currently.  On Lovenox currently at this time.  Will defer whether or not to initiate heparin gtt. and timing to primary Hospitalist service   Shellia Cleverly, DO  09/16/2022, 10:53 AM [facility] Pager [phone]

## 2022-09-16 NOTE — Progress Notes (Signed)
Patient  said her pain is now 9/10, I just gave her the Fentanyl. EKG done and it's available in epic. Notified Dr. Arlean Hopping to review the EKG. Patient  talkative, is A & O but forgetful. Will continue to monitor.

## 2022-09-16 NOTE — Progress Notes (Signed)
[facility]                                                                               Maria Henry, is a 82 y.o. female, DOB - June 21, 1940, HYQ:[id number] Admit date - 09/14/2022    Outpatient Primary MD for the patient is [name], MD  LOS - 0  days    Brief summary     82 y.o. female with medical history significant for obesity, autoimmune hepatitis, OSA, recent history of DVT on Eliquis since January 2024, hypothyroidism, hypertension, hyperlipidemia, stroke, CKD 3B, who initially presented to [facility] ED due to shoulder pain and epigastric pain.  She was found to be anemic, with heme negative stools.  Assessment & Plan    Assessment and Plan:   Epigastric pain, nausea, and diarrhea  Unclear etiology.  Epigastric pain appears to be improving.  No nausea or vomiting today.  C diff pcr sent for further evaluation.     Heme negative stools and anemia  GI consulted, recommended outpatient work up , no overt bleeding.  Anemia panel shows iron def anemia.  Will plan for IV iron prior to discharge.  Hemoglobin on admission was around 6.9 , ( baseline hemoglobin is around 9) underwent 1 unit of prbc transfusion and repeat hemoglobin improved to 7.7 further dropped to 7, again no signs of melena or hematemesis.  Unclear if the drop in hemoglobin is due to hemodilution vs real drop.  Another unit of prbc ordered for tonight and repeat hemoglobin in the morning.  Meanwhile we will keep her on clears.     Auto immune hepatitis with cirrhosis:  Continue with Imuran and follow up with Hepatology as scheduled .    H/o of DVT in 05/2022, unclear if it was provoked or unprovoked.  She has completed 4 months of DOAC.    Mildly elevated d dimer and epigastric pain:  Troponin is negative.  Get V/Q scan to check for PE.  She was initially started on IV heparin empirically, but stopped within an hour due to the drop in hemoglobin from 7.7 to 7.     Stage 3 b  CKD;  Creatinine at baseline around 1.7.  Hemoglobin at 1.4 today and stable.    Dementia:  Continue with namenda and aricept.     Hyperlipidemia:  Resume statin.    H/o CVA in the past:  Not on any anti platelet agents. On eliquis at home which is hold for possible gi bleed/ anemia.      RN Pressure Injury Documentation: Pressure Injury 09/15/22 Sacrum Mid Stage 1 -  Intact skin with non-blanchable redness of a localized area usually over a bony prominence. pink (Active)  09/15/22 0230  Location: Sacrum  Location Orientation: Mid  Staging: Stage 1 -  Intact skin with non-blanchable redness of a localized area usually over a bony prominence.  Wound Description (Comments): pink  Present on Admission: Yes  Dressing Type Foam - Lift dressing to assess site every shift 09/16/22 0800    Estimated body mass index is 30.77 kg/m as calculated from the following:   Height as of this encounter: 5\' 2"  (1.575 m).  Weight as of this encounter: 76.3 kg.  Code Status: FULL CODE.  DVT Prophylaxis:  SCD'S   Level of Care: Level of care: Telemetry Cardiac Family Communication: Updated patient's   Disposition Plan:     Remains inpatient appropriate:    Procedures: CT abd and pelvis.  Venous duplex of the lower extremities.    Consultants:   GI   Antimicrobials:   Anti-infectives (From admission, onward)    None        Medications  Scheduled Meds:  sodium chloride   Intravenous Once   atorvastatin  40 mg Oral Daily   levothyroxine  100 mcg Oral QAC breakfast   pantoprazole (PROTONIX) IV  40 mg Intravenous BID   Continuous Infusions: PRN Meds:.ondansetron (ZOFRAN) IV    Subjective:   Maria Henry was seen and examined today.  Epigastric pain is better, 3 loose BM in the last 24 hours .no nausea or vomiting. No chest pain.  No sob, she is on RA.   Objective:   Vitals:   09/15/22 1945 09/15/22 2302 09/16/22 0315 09/16/22 0712  BP: (!) 146/48 (!) 149/55  (!) 149/53 (!) 146/58  Pulse: 65 67 66 (!) 59  Resp: 13 17 19 11   Temp: 97.6 F (36.4 C) 97.6 F (36.4 C) 97.9 F (36.6 C) 97.6 F (36.4 C)  TempSrc:  Oral Oral Oral  SpO2: 95% 98% 97% 94%  Weight:      Height:        Intake/Output Summary (Last 24 hours) at 09/16/2022 1459 Last data filed at 09/16/2022 1122 Gross per 24 hour  Intake 790.05 ml  Output 6 ml  Net 784.05 ml   Filed Weights   09/14/22 1806 09/15/22 0236  Weight: 70.3 kg 76.3 kg     Exam General exam: Appears calm and comfortable  Respiratory system: Clear to auscultation. Respiratory effort normal. Cardiovascular system: S1 & S2 heard, RRR. No JVD,  Gastrointestinal:  Abdomen is nondistended, soft and nontender.  Central nervous system: Alert and oriented. No focal neurological deficits. Extremities: Symmetric 5 x 5 power. Skin: No rashes, lesions or ulcers Psychiatry:  Mood & affect appropriate.     Data Reviewed:  I have personally reviewed following labs and imaging studies   CBC Lab Results  Component Value Date   WBC 4.2 09/16/2022   RBC 2.31 (L) 09/16/2022   HGB 7.0 (L) 09/16/2022   HCT 21.5 (L) 09/16/2022   MCV 93.1 09/16/2022   MCH 30.3 09/16/2022   PLT 182 09/16/2022   MCHC 32.6 09/16/2022   RDW 17.0 (H) 09/16/2022     Last metabolic panel Lab Results  Component Value Date   NA 139 09/16/2022   K 4.0 09/16/2022   CL 113 (H) 09/16/2022   CO2 18 (L) 09/16/2022   BUN 10 09/16/2022   CREATININE 1.45 (H) 09/16/2022   GLUCOSE 80 09/16/2022   GFRNONAA 36 (L) 09/16/2022   GFRAA 39 (L) 12/12/2013   CALCIUM 8.3 (L) 09/16/2022   PHOS 2.7 09/15/2022   PROT 5.8 (L) 09/15/2022   ALBUMIN 3.2 (L) 09/15/2022   BILITOT 0.5 09/15/2022   ALKPHOS 67 09/15/2022   AST 15 09/15/2022   ALT 11 09/15/2022   ANIONGAP 8 09/16/2022    CBG (last 3)  No results for input(s): "GLUCAP" in the last 72 hours.    Coagulation Profile: Recent Labs  Lab 09/15/22 1420  INR 1.3*     Radiology  Studies: DG CHEST PORT 1 VIEW  Result Date: 09/16/2022  CLINICAL DATA:  Epigastric pain. EXAM: PORTABLE CHEST 1 VIEW COMPARISON:  09/14/2022 and older exams. FINDINGS: Cardiac silhouette is normal in size. Bilateral hilar prominence, right greater than left, is presumed to be vascular accentuated by the semi-erect technique. No convincing mediastinal masses. Clear lungs.  No pleural effusion or pneumothorax. Skeletal structures are grossly intact. IMPRESSION: No active disease. Electronically Signed   By: Amie Portland M.D.   On: 09/16/2022 08:07   CT ABDOMEN PELVIS WO CONTRAST  Result Date: 09/15/2022 CLINICAL DATA:  Epigastric abdominal pain EXAM: CT ABDOMEN AND PELVIS WITHOUT CONTRAST TECHNIQUE: Multidetector CT imaging of the abdomen and pelvis was performed following the standard protocol without IV contrast. RADIATION DOSE REDUCTION: This exam was performed according to the departmental dose-optimization program which includes automated exposure control, adjustment of the mA and/or kV according to patient size and/or use of iterative reconstruction technique. COMPARISON:  None Available. FINDINGS: Lower chest: No acute abnormality. Hepatobiliary: No focal liver abnormality is seen. Status post cholecystectomy. No biliary dilatation. Pancreas: Unremarkable Spleen: Unremarkable Adrenals/Urinary Tract: The adrenal glands are unremarkable. The kidneys are normal in position. Moderate bilateral renal cortical atrophy. And exophytic 20 mm lesion is seen arising from the interpolar region of the right kidney that was better assessed on prior sonogram of 07/26/2021 and is compatible with a hyperdense cortical cyst. Smaller simple cortical cyst arises from the upper pole the right kidney. No follow-up imaging is recommended for these lesions. The kidneys are otherwise unremarkable. The bladder is unremarkable. Stomach/Bowel: Stomach is within normal limits. Appendix appears normal. No evidence of bowel wall  thickening, distention, or inflammatory changes. Vascular/Lymphatic: Aortic atherosclerosis. No enlarged abdominal or pelvic lymph nodes. Reproductive: Status post hysterectomy. No adnexal masses. Other: No abdominal wall hernia or abnormality. No abdominopelvic ascites. Musculoskeletal: Remote healed fracture deformity of the left ischium. Remote severe anterior wedge compression deformity of L1 with 80% loss of height and minimal retropulsion of the posterosuperior aspect of the vertebral body by 3 mm. Remote superior endplate fracture of L3. Degenerative changes are seen within the lumbar spine. Bilateral laminectomy and posterior decompression of L5-S1. IMPRESSION: 1. No acute intra-abdominal pathology identified. No definite radiographic explanation for the patient's reported symptoms. 2. Moderate bilateral renal cortical atrophy. 3. Remote severe anterior wedge compression deformity of L1 with 80% loss of height and minimal retropulsion of the posterosuperior aspect of the vertebral body by 3 mm. Remote superior endplate fracture of L3. Remote healed fracture deformity of the left ischium. No acute bone abnormality. Aortic Atherosclerosis (ICD10-I70.0). Electronically Signed   By: Helyn Numbers M.D.   On: 09/15/2022 21:17   VAS [place] LOWER EXTREMITY VENOUS (DVT)  Result Date: 09/15/2022  Lower Venous DVT Study Patient Name:  Maria Henry  Date of Exam:   09/15/2022 Medical Rec #: [id number]        Accession #:    [id number] Date of Birth: 1940-09-07        Patient Gender: F Patient Age:   64 years Exam Location:  [facility] Procedure:      VAS [place] LOWER EXTREMITY VENOUS (DVT) Referring Phys: Enid Derry HALL --------------------------------------------------------------------------------  Indications: Elevated D-Dimer, and Pain.  Limitations: Body habitus and Edema. Comparison Study: No prior study on file Performing Technologist: Sherren Kerns RVS  Examination Guidelines: A complete evaluation includes  B-mode imaging, spectral Doppler, color Doppler, and power Doppler as needed of all accessible portions of each vessel. Bilateral testing is considered an integral part of a complete examination. Limited examinations  for reoccurring indications may be performed as noted. The reflux portion of the exam is performed with the patient in reverse Trendelenburg.  +---------+---------------+---------+-----------+----------+-------------------+ RIGHT    CompressibilityPhasicitySpontaneityPropertiesThrombus Aging      +---------+---------------+---------+-----------+----------+-------------------+ CFV      Full           Yes      Yes                                      +---------+---------------+---------+-----------+----------+-------------------+ SFJ      Full                                                             +---------+---------------+---------+-----------+----------+-------------------+ FV Prox  Full                                                             +---------+---------------+---------+-----------+----------+-------------------+ FV Mid   Full           Yes      Yes                                      +---------+---------------+---------+-----------+----------+-------------------+ FV DistalFull                                                             +---------+---------------+---------+-----------+----------+-------------------+ PFV      Full                                                             +---------+---------------+---------+-----------+----------+-------------------+ POP      Full           Yes      Yes                                      +---------+---------------+---------+-----------+----------+-------------------+ PTV                                                   Not well visualized +---------+---------------+---------+-----------+----------+-------------------+ PERO                                                   Not well visualized +---------+---------------+---------+-----------+----------+-------------------+   +---------+---------------+---------+-----------+----------+-------------------+ LEFT  CompressibilityPhasicitySpontaneityPropertiesThrombus Aging      +---------+---------------+---------+-----------+----------+-------------------+ CFV      Full           Yes      Yes                                      +---------+---------------+---------+-----------+----------+-------------------+ SFJ      Full                                                             +---------+---------------+---------+-----------+----------+-------------------+ FV Prox  Full                                                             +---------+---------------+---------+-----------+----------+-------------------+ FV Mid   Full           Yes      Yes                                      +---------+---------------+---------+-----------+----------+-------------------+ FV DistalFull                                                             +---------+---------------+---------+-----------+----------+-------------------+ PFV      Full                                                             +---------+---------------+---------+-----------+----------+-------------------+ POP      Full           Yes      Yes                                      +---------+---------------+---------+-----------+----------+-------------------+ PTV                                                   Not well visualized +---------+---------------+---------+-----------+----------+-------------------+ PERO                                                  Not well visualized +---------+---------------+---------+-----------+----------+-------------------+     Summary: RIGHT: - There is no evidence of deep vein thrombosis in the lower extremity. However, portions of this examination were  limited- see technologist comments above.  - No cystic  structure found in the popliteal fossa.  LEFT: - There is no evidence of deep vein thrombosis in the lower extremity. However, portions of this examination were limited- see technologist comments above.  - No cystic structure found in the popliteal fossa.  *See table(s) above for measurements and observations. Electronically signed by Coral Else MD on 09/15/2022 at 3:04:49 PM.    Final    DG Chest Port 1 View  Result Date: 09/14/2022 CLINICAL DATA:  Chest pain, right hand and shoulder pain EXAM: PORTABLE CHEST 1 VIEW COMPARISON:  01/15/2015 FINDINGS: Single frontal view of the chest demonstrates a stable cardiac silhouette. No airspace disease, effusion, or pneumothorax. No acute bony abnormalities. IMPRESSION: 1. No acute intrathoracic process. Electronically Signed   By: Sharlet Salina M.D.   On: 09/14/2022 22:49   DG Shoulder Right  Result Date: 09/14/2022 CLINICAL DATA:  Right shoulder pain, pain with movement EXAM: RIGHT SHOULDER - 2+ VIEW COMPARISON:  None Available. FINDINGS: Internal rotation, external rotation, and transscapular views of the right shoulder are obtained. No acute displaced fracture, subluxation, or dislocation. Moderate hypertrophic changes are seen of the acromioclavicular joint. Mild spurring of the undersurface of the acromion process. Mild glenohumeral joint osteoarthritis. Soft tissues are unremarkable. Right chest is clear. IMPRESSION: 1. Moderate acromioclavicular joint osteoarthropathy. 2. Mild glenohumeral joint osteoarthritis. 3. No acute displaced fracture. Electronically Signed   By: Sharlet Salina M.D.   On: 09/14/2022 18:35       Kathlen Mody M.D. [facility] 09/16/2022, 2:59 PM  Available via Epic secure chat 7am-7pm After 7 pm, please refer to night coverage provider listed on amion.

## 2022-09-16 NOTE — Progress Notes (Signed)
The patient is complaining of mid center chest pain of 4/10 on a pain scale. No PRN pain med on MAR. Notified Dr. Arlean Hopping and received Fentanyl 25 mg IV every 2 hours PRN. Will administer and continue to monitor.

## 2022-09-16 NOTE — Progress Notes (Signed)
[facility] night cross cover note:   I was notified by RN that this patient continues to experience some chest pain, similar to earlier in the day, at which time chest x-ray showed no evidence of acute process, while VQ scan was not suggestive of acute pulmonary embolism.  Prior EKG was without acute ischemic changes, and serial troponin were found to be negative.  Vital signs currently appears stable, with heart rates in the 60s, systolic blood pressures in the 140s, and maintaining oxygen saturations in the high 90s on room air.  RN conveys that the patient does not currently have orders for prn analgesia.  I subsequently ordered prn IV fentanyl.    Newton Pigg, DO Hospitalist

## 2022-09-17 DIAGNOSIS — N189 Chronic kidney disease, unspecified: Secondary | ICD-10-CM

## 2022-09-17 DIAGNOSIS — R1013 Epigastric pain: Secondary | ICD-10-CM | POA: Diagnosis not present

## 2022-09-17 DIAGNOSIS — D649 Anemia, unspecified: Secondary | ICD-10-CM | POA: Diagnosis not present

## 2022-09-17 DIAGNOSIS — D509 Iron deficiency anemia, unspecified: Secondary | ICD-10-CM | POA: Diagnosis not present

## 2022-09-17 DIAGNOSIS — K754 Autoimmune hepatitis: Secondary | ICD-10-CM | POA: Diagnosis not present

## 2022-09-17 DIAGNOSIS — Z7901 Long term (current) use of anticoagulants: Secondary | ICD-10-CM

## 2022-09-17 DIAGNOSIS — K7469 Other cirrhosis of liver: Secondary | ICD-10-CM

## 2022-09-17 LAB — TYPE AND SCREEN
ABO/RH(D): O POS
Antibody Screen: NEGATIVE
Unit division: 0
Unit division: 0

## 2022-09-17 LAB — CBC
HCT: 30.3 % — ABNORMAL LOW (ref 36.0–46.0)
HCT: 23.2 % — ABNORMAL LOW (ref 36.0–46.0)
Hemoglobin: 9.5 g/dL — ABNORMAL LOW (ref 12.0–15.0)
MCH: 29.4 pg (ref 26.0–34.0)
MCH: 29.5 pg (ref 26.0–34.0)
MCHC: 31.4 g/dL (ref 30.0–36.0)
MCV: 93.8 fL (ref 80.0–100.0)
MCV: 99.1 fL (ref 80.0–100.0)
Platelets: 204 10*3/uL (ref 150–400)
Platelets: 224 10*3/uL (ref 150–400)
RBC: 3.23 MIL/uL — ABNORMAL LOW (ref 3.87–5.11)
RDW: 17.6 % — ABNORMAL HIGH (ref 11.5–15.5)
RDW: 17.8 % — ABNORMAL HIGH (ref 11.5–15.5)
WBC: 4.3 10*3/uL (ref 4.0–10.5)
WBC: 7 10*3/uL (ref 4.0–10.5)
nRBC: 0 % (ref 0.0–0.2)
nRBC: 0 % (ref 0.0–0.2)

## 2022-09-17 LAB — BPAM RBC
ISSUE DATE / TIME: 202405040805
ISSUE DATE / TIME: 202405051609
Unit Type and Rh: 5100

## 2022-09-17 LAB — BASIC METABOLIC PANEL
Anion gap: 7 (ref 5–15)
BUN: 11 mg/dL (ref 8–23)
CO2: 19 mmol/L — ABNORMAL LOW (ref 22–32)
Calcium: 8.5 mg/dL — ABNORMAL LOW (ref 8.9–10.3)
Chloride: 112 mmol/L — ABNORMAL HIGH (ref 98–111)
Creatinine, Ser: 1.38 mg/dL — ABNORMAL HIGH (ref 0.44–1.00)
GFR, Estimated: 38 mL/min — ABNORMAL LOW (ref >60)
Glucose, Bld: 81 mg/dL (ref 70–99)
Potassium: 3.9 mmol/L (ref 3.5–5.1)
Sodium: 138 mmol/L (ref 135–145)

## 2022-09-17 LAB — FOLATE: Folate: 7.6 ng/mL (ref >5.9)

## 2022-09-17 LAB — VITAMIN B12: Vitamin B-12: 197 pg/mL (ref 180–914)

## 2022-09-17 LAB — ANTITHROMBIN III: AntiThromb III Func: 121 % — ABNORMAL HIGH (ref 75–120)

## 2022-09-17 MED ORDER — ACETAMINOPHEN 325 MG PO TABS: 650.0000 mg | ORAL_TABLET | ORAL | Status: DC | PRN

## 2022-09-17 MED ORDER — SODIUM CHLORIDE 0.9 % IV SOLN
25.0000 mg | Freq: Once | INTRAVENOUS | Status: AC
  Administered 2022-09-17: 25 mg via INTRAVENOUS
  Filled 2022-09-17: qty 0.5

## 2022-09-17 MED ORDER — HYDRALAZINE HCL 25 MG PO TABS
25.0000 mg | ORAL_TABLET | Freq: Four times a day (QID) | ORAL | Status: DC | PRN
  Filled 2022-09-17: qty 1

## 2022-09-17 MED ORDER — SODIUM CHLORIDE 0.9 % IV SOLN
1000.0000 mg | Freq: Once | INTRAVENOUS | Status: AC
  Administered 2022-09-17: 1000 mg via INTRAVENOUS
  Filled 2022-09-17: qty 20

## 2022-09-17 MED ORDER — TRAMADOL HCL 50 MG PO TABS
50.0000 mg | ORAL_TABLET | Freq: Four times a day (QID) | ORAL | Status: DC | PRN
  Administered 2022-09-17: 50 mg via ORAL
  Filled 2022-09-17: qty 1

## 2022-09-17 NOTE — Progress Notes (Addendum)
Progress Note  Primary GI:  Dr.Groh/ [facility]    Subjective  Chief Complaint: anemia/epigastric pain   No family was present at the time of my evaluation. [name], states her memory is not as good as it was. But denies any bowel movements, no hematochezia or melena. Continues to have some mild epigastric discomfort that she states is always there.  Has some intermittent chest pain that she had previously not worse with food or drink.  Patient has had some dysphagia to liquids, no odynophagia.    Objective   Vital signs in last 24 hours: Temp:  [97.6 F (36.4 C)-98.6 F (37 C)] 98.6 F (37 C) (05/06 0815) Pulse Rate:  [59-71] 71 (05/06 0815) Resp:  [10-23] 23 (05/06 0815) BP: (138-175)/(46-70) 138/49 (05/06 0815) SpO2:  [93 %-97 %] 95 % (05/06 0815) Last BM Date : 09/16/22 Last BM recorded by nurses in past 5 days Stool Type: Type 7 (Liquid consistency with no solid pieces) (09/16/2022  4:00 PM)  General:   female in no acute distress  Heart:  Regular rate and rhythm; no murmurs Pulm: Clear anteriorly; no wheezing Abdomen:  Soft, Obese AB, Active bowel sounds. No tenderness.  No fluid wave no palpable mass. Extremities:  with mild nonpitting edema. Neurologic:  Alert and  oriented x4;  No focal deficits.  Psych:  Cooperative. Normal mood and affect.  Intake/Output from previous day: 05/05 0701 - 05/06 0700 In: 353.6 [P.O.:20; I.V.:18.6; Blood:315] Out: 6 [Urine:4; Stool:2] Intake/Output this shift: No intake/output data recorded.  Studies/Results: NM Pulmonary Perfusion  Result Date: 09/16/2022 CLINICAL DATA:  Chest pain, nonspecific, DVT on chronic anticoagulation EXAM: NUCLEAR MEDICINE PERFUSION LUNG SCAN TECHNIQUE: Perfusion images were obtained in multiple projections after intravenous injection of radiopharmaceutical. Ventilation scans intentionally deferred if perfusion scan and chest x-ray adequate for  interpretation during COVID 19 epidemic. RADIOPHARMACEUTICALS:  4.4 mCi Tc-92m MAA IV COMPARISON:  None Available. FINDINGS: There is a uniform distribution of radiotracer within the lungs bilaterally. No significant perfusion defects are identified. IMPRESSION: Normal exam; no evidence of pulmonary embolism. Electronically Signed   By: Helyn Numbers M.D.   On: 09/16/2022 20:54   DG CHEST PORT 1 VIEW  Result Date: 09/16/2022 CLINICAL DATA:  Epigastric pain. EXAM: PORTABLE CHEST 1 VIEW COMPARISON:  09/14/2022 and older exams. FINDINGS: Cardiac silhouette is normal in size. Bilateral hilar prominence, right greater than left, is presumed to be vascular accentuated by the semi-erect technique. No convincing mediastinal masses. Clear lungs.  No pleural effusion or pneumothorax. Skeletal structures are grossly intact. IMPRESSION: No active disease. Electronically Signed   By: Amie Portland M.D.   On: 09/16/2022 08:07   CT ABDOMEN PELVIS WO CONTRAST  Result Date: 09/15/2022 CLINICAL DATA:  Epigastric abdominal pain EXAM: CT ABDOMEN AND PELVIS WITHOUT CONTRAST TECHNIQUE: Multidetector CT imaging of the abdomen and pelvis was performed following the standard protocol without IV contrast. RADIATION DOSE REDUCTION: This exam was performed according to the departmental dose-optimization program which includes automated exposure control, adjustment of the mA and/or kV according to patient size and/or use of iterative reconstruction technique. COMPARISON:  None Available. FINDINGS: Lower chest: No acute abnormality. Hepatobiliary: No focal liver abnormality is seen. Status post cholecystectomy. No biliary dilatation. Pancreas: Unremarkable Spleen: Unremarkable Adrenals/Urinary Tract: The adrenal glands are unremarkable. The kidneys are normal in position. Moderate bilateral renal cortical atrophy. And exophytic 20 mm lesion is seen arising from the interpolar region of the right kidney  that was better assessed on prior  sonogram of 07/26/2021 and is compatible with a hyperdense cortical cyst. Smaller simple cortical cyst arises from the upper pole the right kidney. No follow-up imaging is recommended for these lesions. The kidneys are otherwise unremarkable. The bladder is unremarkable. Stomach/Bowel: Stomach is within normal limits. Appendix appears normal. No evidence of bowel wall thickening, distention, or inflammatory changes. Vascular/Lymphatic: Aortic atherosclerosis. No enlarged abdominal or pelvic lymph nodes. Reproductive: Status post hysterectomy. No adnexal masses. Other: No abdominal wall hernia or abnormality. No abdominopelvic ascites. Musculoskeletal: Remote healed fracture deformity of the left ischium. Remote severe anterior wedge compression deformity of L1 with 80% loss of height and minimal retropulsion of the posterosuperior aspect of the vertebral body by 3 mm. Remote superior endplate fracture of L3. Degenerative changes are seen within the lumbar spine. Bilateral laminectomy and posterior decompression of L5-S1. IMPRESSION: 1. No acute intra-abdominal pathology identified. No definite radiographic explanation for the patient's reported symptoms. 2. Moderate bilateral renal cortical atrophy. 3. Remote severe anterior wedge compression deformity of L1 with 80% loss of height and minimal retropulsion of the posterosuperior aspect of the vertebral body by 3 mm. Remote superior endplate fracture of L3. Remote healed fracture deformity of the left ischium. No acute bone abnormality. Aortic Atherosclerosis (ICD10-I70.0). Electronically Signed   By: Helyn Numbers M.D.   On: 09/15/2022 21:17   VAS [place] LOWER EXTREMITY VENOUS (DVT)  Result Date: 09/15/2022  Lower Venous DVT Study Patient Name:  Maria Henry  Date of Exam:   09/15/2022 Medical Rec #: [id number]        Accession #:    [id number] Date of Birth: 1940/08/06        Patient Gender: F Patient Age:   82 years Exam Location:  [facility] Procedure:       VAS [place] LOWER EXTREMITY VENOUS (DVT) Referring Phys: Enid Derry HALL --------------------------------------------------------------------------------  Indications: Elevated D-Dimer, and Pain.  Limitations: Body habitus and Edema. Comparison Study: No prior study on file Performing Technologist: Sherren Kerns RVS  Examination Guidelines: A complete evaluation includes B-mode imaging, spectral Doppler, color Doppler, and power Doppler as needed of all accessible portions of each vessel. Bilateral testing is considered an integral part of a complete examination. Limited examinations for reoccurring indications may be performed as noted. The reflux portion of the exam is performed with the patient in reverse Trendelenburg.  +---------+---------------+---------+-----------+----------+-------------------+ RIGHT    CompressibilityPhasicitySpontaneityPropertiesThrombus Aging      +---------+---------------+---------+-----------+----------+-------------------+ CFV      Full           Yes      Yes                                      +---------+---------------+---------+-----------+----------+-------------------+ SFJ      Full                                                             +---------+---------------+---------+-----------+----------+-------------------+ FV Prox  Full                                                             +---------+---------------+---------+-----------+----------+-------------------+  FV Mid   Full           Yes      Yes                                      +---------+---------------+---------+-----------+----------+-------------------+ FV DistalFull                                                             +---------+---------------+---------+-----------+----------+-------------------+ PFV      Full                                                             +---------+---------------+---------+-----------+----------+-------------------+ POP       Full           Yes      Yes                                      +---------+---------------+---------+-----------+----------+-------------------+ PTV                                                   Not well visualized +---------+---------------+---------+-----------+----------+-------------------+ PERO                                                  Not well visualized +---------+---------------+---------+-----------+----------+-------------------+   +---------+---------------+---------+-----------+----------+-------------------+ LEFT     CompressibilityPhasicitySpontaneityPropertiesThrombus Aging      +---------+---------------+---------+-----------+----------+-------------------+ CFV      Full           Yes      Yes                                      +---------+---------------+---------+-----------+----------+-------------------+ SFJ      Full                                                             +---------+---------------+---------+-----------+----------+-------------------+ FV Prox  Full                                                             +---------+---------------+---------+-----------+----------+-------------------+ FV Mid   Full           Yes      Yes                                      +---------+---------------+---------+-----------+----------+-------------------+  FV DistalFull                                                             +---------+---------------+---------+-----------+----------+-------------------+ PFV      Full                                                             +---------+---------------+---------+-----------+----------+-------------------+ POP      Full           Yes      Yes                                      +---------+---------------+---------+-----------+----------+-------------------+ PTV                                                   Not well visualized  +---------+---------------+---------+-----------+----------+-------------------+ PERO                                                  Not well visualized +---------+---------------+---------+-----------+----------+-------------------+     Summary: RIGHT: - There is no evidence of deep vein thrombosis in the lower extremity. However, portions of this examination were limited- see technologist comments above.  - No cystic structure found in the popliteal fossa.  LEFT: - There is no evidence of deep vein thrombosis in the lower extremity. However, portions of this examination were limited- see technologist comments above.  - No cystic structure found in the popliteal fossa.  *See table(s) above for measurements and observations. Electronically signed by Coral Else MD on 09/15/2022 at 3:04:49 PM.    Final     Lab Results: Recent Labs    09/15/22 1420 09/15/22 2109 09/16/22 0953 09/17/22 0016  WBC 5.4  --  4.2 4.3  HGB 7.7* 7.7* 7.0* 9.5*  HCT 25.9* 25.9* 21.5* 30.3*  PLT 216  --  182 204   BMET Recent Labs    09/16/22 0953 09/16/22 1949 09/17/22 0016  NA 139 137 138  K 4.0 3.8 3.9  CL 113* 112* 112*  CO2 18* 18* 19*  GLUCOSE 80 109* 81  BUN 10 11 11   CREATININE 1.45* 1.44* 1.38*  CALCIUM 8.3* 8.7* 8.5*   LFT Recent Labs    09/15/22 0807  PROT 5.8*  ALBUMIN 3.2*  AST 15  ALT 11  ALKPHOS 67  BILITOT 0.5   PT/INR Recent Labs    09/15/22 1420  LABPROT 16.1*  INR 1.3*     Scheduled Meds:  sodium chloride   Intravenous Once   atorvastatin  40 mg Oral Daily   azaTHIOprine  25 mg Oral Daily   donepezil  10 mg Oral q AM   levothyroxine  100 mcg Oral QAC breakfast   memantine  5 mg  Oral BID   pantoprazole (PROTONIX) IV  40 mg Intravenous BID   primidone  50 mg Oral QHS   Continuous Infusions:  iron dextran (INFED/DEXFERRUM) infusion     Followed by   iron dextran (INFED/DEXFERRUM) infusion        Patient profile:   82 year old female with history of  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  Hemoccult negative.  EGD 11/2020 small hiatal hernia no varices or GAVE, colonoscopy 2018 6 mm polyp sigmoid small internal hemorrhoids   Impression/Plan:   Acute on chronic anemia was on Eliquis for DVT 05/2022 currently on hold History of iron deficiency on chronic oral iron -08/2020 EGD no evidence of varices or portal gastropathy CT abdomen pelvis unremarkable Colonoscopy 2018 1 small polyp and internal hemorrhoids.  Hemoglobin of 9.19 May 2022 on admission 6.9  Hemoglobin increased to 7.7 status post 1 unit decreased back to 7, received another unit and is currently at 9.5 Very robust response to 1 unit PRBC from 7-9.5, I do question the validity of hemoglobin of 7 without any further GI bleeding or bowel movements versus dilutional. -Would benefit from IV iron this admission -Continue PPI twice daily -Continue to monitor H&H transfusing to keep above 7 -Continue clear liquid diet for now, n.p.o. in the morning. -Will schedule for upper endoscopy tomorrow with Dr. Rhea Belton, she feels she would not build to complete colon prep. Will schedule EGD. I discussed risks of EGD with patient and POA, Pat today, including risk of sedation, bleeding or perforation.  Consent from POA is in the chart.   Recent DVT Eliquis on hold LE Dopplers were negative, negative VQ scan  Autoimmune hepatitis with cirrhosis MELD 3.0: 14 at 09/17/2022 12:16 AM MELD-Na: 13 at 09/17/2022 12:16 AM Calculated from: Serum Creatinine: 1.38 mg/dL at 05/19/1094 04:54 AM Serum Sodium: 138 mmol/L (Using max of 137 mmol/L) at 09/17/2022 12:16 AM Total Bilirubin: 0.5 mg/dL (Using min of 1 mg/dL) at 0/01/8118  1:47 AM Serum Albumin: 3.2 g/dL at 12/13/9560  1:30 AM INR(ratio): 1.3 at 09/15/2022  2:20 PM Age  at listing (hypothetical): 61 years Sex: Female at 09/17/2022 12:16 AM Continue Imuran Appears to be compensated at this time follows with [facility]  Prior CVA with dementia  CKD Likely attributing to anemia   Principal Problem:   Acute on chronic anemia Active Problems:   Iron deficiency anemia   Autoimmune hepatitis (HCC)   Epigastric pain   Pressure injury of skin    LOS: 1 day   Doree Albee  09/17/2022, 10:03 AM

## 2022-09-17 NOTE — TOC Initial Note (Signed)
Transition of Care [facility]) - Initial/Assessment Note    Patient Details  Name: Maria Henry MRN: [id number] Date of Birth: 1940/07/25  [facility] CM/SW Contact:    Harriet Masson, RN Phone Number: 09/17/2022, 3:27 PM  Clinical Narrative:                 Sherron Monday to Dennie Bible, patient's friend and HPOA. Dennie Bible states patient lives with her and her nephew. Patient has all needed DME.  Choice offered for home health and Dennie Bible defers to this RNCM to find highly rated agency. Cory with [facility] accepted referral.  TOC will continue to follow for needs.   Expected Discharge Plan: Home w Home Health Services Barriers to Discharge: Continued Medical Work up   Patient Goals and CMS Choice Patient states their goals for this hospitalization and ongoing recovery are:: return home CMS Medicare.gov Compare Post Acute Care list provided to:: Patient Represenative (must comment) Choice offered to / list presented to : [facility] POA / Guardian      Expected Discharge Plan and Services     Post Acute Care Choice: Home Health Living arrangements for the past 2 months: Single Family Home                           HH Arranged: PT, OT, Nurse's Aide HH Agency: [facility] Agency Contacted: 09/17/22 Time HH Agency Contacted: 1527 Representative spoke with at [facility] Agency: Kandee Keen  Prior Living Arrangements/Services Living arrangements for the past 2 months: Single Family Home Lives with:: Other (Comment) (friend and her nephew and girllfriend) Patient language and need for interpreter reviewed:: Yes Do you feel safe going back to the place where you live?: Yes      Need for Family Participation in Patient Care: Yes (Comment) Care giver support system in place?: Yes (comment)   Criminal Activity/Legal Involvement Pertinent to Current Situation/Hospitalization: No - Comment as needed  Activities of Daily Living Home Assistive Devices/Equipment: Dan Humphreys (specify type) ADL  Screening (condition at time of admission) Patient's cognitive ability adequate to safely complete daily activities?: Yes Is the patient deaf or have difficulty hearing?: No Does the patient have difficulty seeing, even when wearing glasses/contacts?: Yes Does the patient have difficulty concentrating, remembering, or making decisions?: No Patient able to express need for assistance with ADLs?: Yes Does the patient have difficulty dressing or bathing?: No Independently performs ADLs?: Yes (appropriate for developmental age) Does the patient have difficulty walking or climbing stairs?: Yes Weakness of Legs: Both Weakness of Arms/Hands: Both  Permission Sought/Granted                  Emotional Assessment Appearance:: Appears stated age Attitude/Demeanor/Rapport: Gracious Affect (typically observed): Accepting   Alcohol / Substance Use: Not Applicable Psych Involvement: No (comment)  Admission diagnosis:  Anemia, unspecified type [D64.9] Acute on chronic anemia [D64.9] Epigastric pain [R10.13] Patient Active Problem List   Diagnosis Date Noted   Iron deficiency anemia 09/16/2022   Autoimmune hepatitis (HCC) 09/16/2022   Epigastric pain 09/16/2022   Pressure injury of skin 09/16/2022   Acute on chronic anemia 09/15/2022   Osteoarthritis of right knee 12/10/2013   H/O total knee replacement 12/10/2013   PCP:  [name], MD Pharmacy:   [facility] [place], [place] - [address] [place] [address] Phone: [phone] Fax: [phone]  [facility] -  [place], [place] - [address] [place] [address] Phone: [phone] Fax: [phone]     Social Determinants of Health (SDOH) Social History: SDOH Screenings   Food Insecurity: No Food Insecurity (09/15/2022)  Housing: Low Risk  (09/15/2022)  Transportation Needs: No Transportation Needs (09/15/2022)   Utilities: Not At Risk (09/15/2022)  Tobacco Use: Low Risk  (09/15/2022)   SDOH Interventions:     Readmission Risk Interventions     No data to display

## 2022-09-17 NOTE — H&P (View-Only) (Signed)
  Progress Note  Primary GI:  Dr.Groh/ [facility]    Subjective  Chief Complaint: anemia/epigastric pain   No family was present at the time of my evaluation. [name], states her memory is not as good as it was. But denies any bowel movements, no hematochezia or melena. Continues to have some mild epigastric discomfort that she states is always there.  Has some intermittent chest pain that she had previously not worse with food or drink.  Patient has had some dysphagia to liquids, no odynophagia.    Objective   Vital signs in last 24 hours: Temp:  [97.6 F (36.4 C)-98.6 F (37 C)] 98.6 F (37 C) (05/06 0815) Pulse Rate:  [59-71] 71 (05/06 0815) Resp:  [10-23] 23 (05/06 0815) BP: (138-175)/(46-70) 138/49 (05/06 0815) SpO2:  [93 %-97 %] 95 % (05/06 0815) Last BM Date : 09/16/22 Last BM recorded by nurses in past 5 days Stool Type: Type 7 (Liquid consistency with no solid pieces) (09/16/2022  4:00 PM)  General:   female in no acute distress  Heart:  Regular rate and rhythm; no murmurs Pulm: Clear anteriorly; no wheezing Abdomen:  Soft, Obese AB, Active bowel sounds. No tenderness.  No fluid wave no palpable mass. Extremities:  with mild nonpitting edema. Neurologic:  Alert and  oriented x4;  No focal deficits.  Psych:  Cooperative. Normal mood and affect.  Intake/Output from previous day: 05/05 0701 - 05/06 0700 In: 353.6 [P.O.:20; I.V.:18.6; Blood:315] Out: 6 [Urine:4; Stool:2] Intake/Output this shift: No intake/output data recorded.  Studies/Results: NM Pulmonary Perfusion  Result Date: 09/16/2022 CLINICAL DATA:  Chest pain, nonspecific, DVT on chronic anticoagulation EXAM: NUCLEAR MEDICINE PERFUSION LUNG SCAN TECHNIQUE: Perfusion images were obtained in multiple projections after intravenous injection of radiopharmaceutical. Ventilation scans intentionally deferred if perfusion scan and chest x-ray adequate for  interpretation during COVID 19 epidemic. RADIOPHARMACEUTICALS:  4.4 mCi Tc-99m MAA IV COMPARISON:  None Available. FINDINGS: There is a uniform distribution of radiotracer within the lungs bilaterally. No significant perfusion defects are identified. IMPRESSION: Normal exam; no evidence of pulmonary embolism. Electronically Signed   By: Ashesh  Parikh M.D.   On: 09/16/2022 20:54   DG CHEST PORT 1 VIEW  Result Date: 09/16/2022 CLINICAL DATA:  Epigastric pain. EXAM: PORTABLE CHEST 1 VIEW COMPARISON:  09/14/2022 and older exams. FINDINGS: Cardiac silhouette is normal in size. Bilateral hilar prominence, right greater than left, is presumed to be vascular accentuated by the semi-erect technique. No convincing mediastinal masses. Clear lungs.  No pleural effusion or pneumothorax. Skeletal structures are grossly intact. IMPRESSION: No active disease. Electronically Signed   By: David  Ormond M.D.   On: 09/16/2022 08:07   CT ABDOMEN PELVIS WO CONTRAST  Result Date: 09/15/2022 CLINICAL DATA:  Epigastric abdominal pain EXAM: CT ABDOMEN AND PELVIS WITHOUT CONTRAST TECHNIQUE: Multidetector CT imaging of the abdomen and pelvis was performed following the standard protocol without IV contrast. RADIATION DOSE REDUCTION: This exam was performed according to the departmental dose-optimization program which includes automated exposure control, adjustment of the mA and/or kV according to patient size and/or use of iterative reconstruction technique. COMPARISON:  None Available. FINDINGS: Lower chest: No acute abnormality. Hepatobiliary: No focal liver abnormality is seen. Status post cholecystectomy. No biliary dilatation. Pancreas: Unremarkable Spleen: Unremarkable Adrenals/Urinary Tract: The adrenal glands are unremarkable. The kidneys are normal in position. Moderate bilateral renal cortical atrophy. And exophytic 20 mm lesion is seen arising from the interpolar region of the right kidney   that was better assessed on prior  sonogram of 07/26/2021 and is compatible with a hyperdense cortical cyst. Smaller simple cortical cyst arises from the upper pole the right kidney. No follow-up imaging is recommended for these lesions. The kidneys are otherwise unremarkable. The bladder is unremarkable. Stomach/Bowel: Stomach is within normal limits. Appendix appears normal. No evidence of bowel wall thickening, distention, or inflammatory changes. Vascular/Lymphatic: Aortic atherosclerosis. No enlarged abdominal or pelvic lymph nodes. Reproductive: Status post hysterectomy. No adnexal masses. Other: No abdominal wall hernia or abnormality. No abdominopelvic ascites. Musculoskeletal: Remote healed fracture deformity of the left ischium. Remote severe anterior wedge compression deformity of L1 with 80% loss of height and minimal retropulsion of the posterosuperior aspect of the vertebral body by 3 mm. Remote superior endplate fracture of L3. Degenerative changes are seen within the lumbar spine. Bilateral laminectomy and posterior decompression of L5-S1. IMPRESSION: 1. No acute intra-abdominal pathology identified. No definite radiographic explanation for the patient's reported symptoms. 2. Moderate bilateral renal cortical atrophy. 3. Remote severe anterior wedge compression deformity of L1 with 80% loss of height and minimal retropulsion of the posterosuperior aspect of the vertebral body by 3 mm. Remote superior endplate fracture of L3. Remote healed fracture deformity of the left ischium. No acute bone abnormality. Aortic Atherosclerosis (ICD10-I70.0). Electronically Signed   By: Ashesh  Parikh M.D.   On: 09/15/2022 21:17   VAS US LOWER EXTREMITY VENOUS (DVT)  Result Date: 09/15/2022  Lower Venous DVT Study Patient Name:  Maria Henry  Date of Exam:   09/15/2022 Medical Rec #: [id number]        Accession #:    [id number] Date of Birth: 06/15/1940        Patient Gender: F Patient Age:   82 years Exam Location:  [facility] Procedure:       VAS US LOWER EXTREMITY VENOUS (DVT) Referring Phys: CAROLE HALL --------------------------------------------------------------------------------  Indications: Elevated D-Dimer, and Pain.  Limitations: Body habitus and Edema. Comparison Study: No prior study on file Performing Technologist: Candace Kanady RVS  Examination Guidelines: A complete evaluation includes B-mode imaging, spectral Doppler, color Doppler, and power Doppler as needed of all accessible portions of each vessel. Bilateral testing is considered an integral part of a complete examination. Limited examinations for reoccurring indications may be performed as noted. The reflux portion of the exam is performed with the patient in reverse Trendelenburg.  +---------+---------------+---------+-----------+----------+-------------------+ RIGHT    CompressibilityPhasicitySpontaneityPropertiesThrombus Aging      +---------+---------------+---------+-----------+----------+-------------------+ CFV      Full           Yes      Yes                                      +---------+---------------+---------+-----------+----------+-------------------+ SFJ      Full                                                             +---------+---------------+---------+-----------+----------+-------------------+ FV Prox  Full                                                             +---------+---------------+---------+-----------+----------+-------------------+   FV Mid   Full           Yes      Yes                                      +---------+---------------+---------+-----------+----------+-------------------+ FV DistalFull                                                             +---------+---------------+---------+-----------+----------+-------------------+ PFV      Full                                                             +---------+---------------+---------+-----------+----------+-------------------+ POP       Full           Yes      Yes                                      +---------+---------------+---------+-----------+----------+-------------------+ PTV                                                   Not well visualized +---------+---------------+---------+-----------+----------+-------------------+ PERO                                                  Not well visualized +---------+---------------+---------+-----------+----------+-------------------+   +---------+---------------+---------+-----------+----------+-------------------+ LEFT     CompressibilityPhasicitySpontaneityPropertiesThrombus Aging      +---------+---------------+---------+-----------+----------+-------------------+ CFV      Full           Yes      Yes                                      +---------+---------------+---------+-----------+----------+-------------------+ SFJ      Full                                                             +---------+---------------+---------+-----------+----------+-------------------+ FV Prox  Full                                                             +---------+---------------+---------+-----------+----------+-------------------+ FV Mid   Full           Yes      Yes                                      +---------+---------------+---------+-----------+----------+-------------------+   FV DistalFull                                                             +---------+---------------+---------+-----------+----------+-------------------+ PFV      Full                                                             +---------+---------------+---------+-----------+----------+-------------------+ POP      Full           Yes      Yes                                      +---------+---------------+---------+-----------+----------+-------------------+ PTV                                                   Not well visualized  +---------+---------------+---------+-----------+----------+-------------------+ PERO                                                  Not well visualized +---------+---------------+---------+-----------+----------+-------------------+     Summary: RIGHT: - There is no evidence of deep vein thrombosis in the lower extremity. However, portions of this examination were limited- see technologist comments above.  - No cystic structure found in the popliteal fossa.  LEFT: - There is no evidence of deep vein thrombosis in the lower extremity. However, portions of this examination were limited- see technologist comments above.  - No cystic structure found in the popliteal fossa.  *See table(s) above for measurements and observations. Electronically signed by Vance Brabham MD on 09/15/2022 at 3:04:49 PM.    Final     Lab Results: Recent Labs    09/15/22 1420 09/15/22 2109 09/16/22 0953 09/17/22 0016  WBC 5.4  --  4.2 4.3  HGB 7.7* 7.7* 7.0* 9.5*  HCT 25.9* 25.9* 21.5* 30.3*  PLT 216  --  182 204   BMET Recent Labs    09/16/22 0953 09/16/22 1949 09/17/22 0016  NA 139 137 138  K 4.0 3.8 3.9  CL 113* 112* 112*  CO2 18* 18* 19*  GLUCOSE 80 109* 81  BUN 10 11 11  CREATININE 1.45* 1.44* 1.38*  CALCIUM 8.3* 8.7* 8.5*   LFT Recent Labs    09/15/22 0807  PROT 5.8*  ALBUMIN 3.2*  AST 15  ALT 11  ALKPHOS 67  BILITOT 0.5   PT/INR Recent Labs    09/15/22 1420  LABPROT 16.1*  INR 1.3*     Scheduled Meds:  sodium chloride   Intravenous Once   atorvastatin  40 mg Oral Daily   azaTHIOprine  25 mg Oral Daily   donepezil  10 mg Oral q AM   levothyroxine  100 mcg Oral QAC breakfast   memantine  5 mg   Oral BID   pantoprazole (PROTONIX) IV  40 mg Intravenous BID   primidone  50 mg Oral QHS   Continuous Infusions:  iron dextran (INFED/DEXFERRUM) infusion     Followed by   iron dextran (INFED/DEXFERRUM) infusion        Patient profile:   82-year-old female with history of  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  Hemoccult negative.  EGD 11/2020 small hiatal hernia no varices or GAVE, colonoscopy 2018 6 mm polyp sigmoid small internal hemorrhoids   Impression/Plan:   Acute on chronic anemia was on Eliquis for DVT 05/2022 currently on hold History of iron deficiency on chronic oral iron -08/2020 EGD no evidence of varices or portal gastropathy CT abdomen pelvis unremarkable Colonoscopy 2018 1 small polyp and internal hemorrhoids.  Hemoglobin of 9.19 May 2022 on admission 6.9  Hemoglobin increased to 7.7 status post 1 unit decreased back to 7, received another unit and is currently at 9.5 Very robust response to 1 unit PRBC from 7-9.5, I do question the validity of hemoglobin of 7 without any further GI bleeding or bowel movements versus dilutional. -Would benefit from IV iron this admission -Continue PPI twice daily -Continue to monitor H&H transfusing to keep above 7 -Continue clear liquid diet for now, n.p.o. in the morning. -Will schedule for upper endoscopy tomorrow with Dr. Pyrtle, she feels she would not build to complete colon prep. Will schedule EGD. I discussed risks of EGD with patient and POA, Pat today, including risk of sedation, bleeding or perforation.  Consent from POA is in the chart.   Recent DVT Eliquis on hold LE Dopplers were negative, negative VQ scan  Autoimmune hepatitis with cirrhosis MELD 3.0: 14 at 09/17/2022 12:16 AM MELD-Na: 13 at 09/17/2022 12:16 AM Calculated from: Serum Creatinine: 1.38 mg/dL at 09/17/2022 12:16 AM Serum Sodium: 138 mmol/L (Using max of 137 mmol/L) at 09/17/2022 12:16 AM Total Bilirubin: 0.5 mg/dL (Using min of 1 mg/dL) at 09/15/2022  8:07 AM Serum Albumin: 3.2 g/dL at 09/15/2022  8:07 AM INR(ratio): 1.3 at 09/15/2022  2:20 PM Age  at listing (hypothetical): 82 years Sex: Female at 09/17/2022 12:16 AM Continue Imuran Appears to be compensated at this time follows with [facility]  Prior CVA with dementia  CKD Likely attributing to anemia   Principal Problem:   Acute on chronic anemia Active Problems:   Iron deficiency anemia   Autoimmune hepatitis (HCC)   Epigastric pain   Pressure injury of skin    LOS: 1 day   Nechuma Boven R Mia Milan  09/17/2022, 10:03 AM    

## 2022-09-17 NOTE — Progress Notes (Signed)
[facility]                                                                               Maria Henry, is a 82 y.o. female, DOB - [birth year], ZOX:[id number] Admit date - 09/14/2022    Outpatient Primary MD for the patient is [name], MD  LOS - 1  days    Brief summary     82 y.o. female with medical history significant for obesity, autoimmune hepatitis, OSA, recent history of DVT on Eliquis since January 2024, hypothyroidism, hypertension, hyperlipidemia, stroke, CKD 3B, who initially presented to [facility] ED due to shoulder pain and epigastric pain.  She was found to be anemic, with heme negative stools.   Assessment & Plan    Assessment and Plan:   Epigastric pain, nausea, and diarrhea , mild abdominal pain Unclear etiology.  Epigastric pain appears to be improving.  No nausea or vomiting today.      Heme negative stools and anemia  GI consulted, recommended outpatient work up , no overt bleeding.  Anemia panel shows iron def anemia.  Will plan for IV iron prior to discharge.  Hemoglobin on admission was around 6.9 , ( baseline hemoglobin is around 9) underwent 1 unit of prbc transfusion and repeat hemoglobin improved to 7.7 further dropped to 7, again no signs of melena or hematemesis.  Unclear if the drop in hemoglobin is due to hemodilution vs real drop.  Another unit of prbc ordered for tonight and repeat hemoglobin in the morning.  Meanwhile we will keep her on clears.     Auto immune hepatitis with cirrhosis:  Continue with Imuran and follow up with Hepatology as scheduled .    H/o of DVT in 05/2022, unclear if it was provoked or unprovoked.  She has completed 4 months of DOAC.    Mildly elevated d dimer and epigastric pain:  Troponin is negative.  Negative V/Q scan to check for PE.  She was initially started on IV heparin empirically, but stopped within an hour due to the drop in hemoglobin from 7.7 to 7.     Stage 3 b CKD;   Creatinine at baseline around 1.7.  Hemoglobin at 1.4 and stable.    Dementia:  Continue with namenda and aricept.     Hyperlipidemia:  Resume statin.    H/o CVA in the past:  Not on any anti platelet agents. On eliquis at home which is hold for possible gi bleed/ anemia.    Uncontrolled hypertension:  Improved.  Added hydralazine prn.      RN Pressure Injury Documentation: Pressure Injury 09/15/22 Sacrum Mid Stage 1 -  Intact skin with non-blanchable redness of a localized area usually over a bony prominence. pink (Active)  09/15/22 0230  Location: Sacrum  Location Orientation: Mid  Staging: Stage 1 -  Intact skin with non-blanchable redness of a localized area usually over a bony prominence.  Wound Description (Comments): pink  Present on Admission: Yes  Dressing Type Foam - Lift dressing to assess site every shift 09/16/22 2207    Estimated body mass index is 30.77 kg/m as calculated from the following:   Height as of this  encounter: 5\' 2"  (1.575 m).   Weight as of this encounter: 76.3 kg.  Code Status: FULL CODE.  DVT Prophylaxis:  SCD'S   Level of Care: Level of care: Telemetry Medical Family Communication: Updated patient's   Disposition Plan:     Remains inpatient appropriate:    Procedures: CT abd and pelvis.  Venous duplex of the lower extremities.    Consultants:   GI   Antimicrobials:   Anti-infectives (From admission, onward)    None        Medications  Scheduled Meds:  sodium chloride   Intravenous Once   atorvastatin  40 mg Oral Daily   azaTHIOprine  25 mg Oral Daily   donepezil  10 mg Oral q AM   levothyroxine  100 mcg Oral QAC breakfast   memantine  5 mg Oral BID   pantoprazole (PROTONIX) IV  40 mg Intravenous BID   primidone  50 mg Oral QHS   Continuous Infusions:  iron dextran (INFED/DEXFERRUM) infusion     Followed by   iron dextran (INFED/DEXFERRUM) infusion     PRN Meds:.acetaminophen, fentaNYL (SUBLIMAZE)  injection, naLOXone (NARCAN)  injection, ondansetron (ZOFRAN) IV, traMADol, traZODone    Subjective:   Ziggy Henry was seen and examined today.   Abd pain, some nausea.  Objective:   Vitals:   09/17/22 0356 09/17/22 0358 09/17/22 0401 09/17/22 0815  BP: (!) 175/70 (!) 171/51 (!) 167/47 (!) 138/49  Pulse:  64  71  Resp: 16   (!) 23  Temp: 97.8 F (36.6 C)   98.6 F (37 C)  TempSrc: Oral   Oral  SpO2: 94%   95%  Weight:      Height:        Intake/Output Summary (Last 24 hours) at 09/17/2022 1018 Last data filed at 09/16/2022 2208 Gross per 24 hour  Intake 353.58 ml  Output 6 ml  Net 347.58 ml    Filed Weights   09/14/22 1806 09/15/22 0236  Weight: 70.3 kg 76.3 kg     Exam General exam: Appears calm and comfortable  Respiratory system: Clear to auscultation. Respiratory effort normal. Cardiovascular system: S1 & S2 heard, RRR.  Gastrointestinal system: Abdomen is nondistended, soft and nontender.  Central nervous system: Alert and oriented. No focal neurological deficits. Extremities: Symmetric 5 x 5 power. Skin: No rashes,  Psychiatry:  Mood & affect appropriate.       Data Reviewed:  I have personally reviewed following labs and imaging studies   CBC Lab Results  Component Value Date   WBC 4.3 09/17/2022   RBC 3.23 (L) 09/17/2022   HGB 9.5 (L) 09/17/2022   HCT 30.3 (L) 09/17/2022   MCV 93.8 09/17/2022   MCH 29.4 09/17/2022   PLT 204 09/17/2022   MCHC 31.4 09/17/2022   RDW 17.6 (H) 09/17/2022     Last metabolic panel Lab Results  Component Value Date   NA 138 09/17/2022   K 3.9 09/17/2022   CL 112 (H) 09/17/2022   CO2 19 (L) 09/17/2022   BUN 11 09/17/2022   CREATININE 1.38 (H) 09/17/2022   GLUCOSE 81 09/17/2022   GFRNONAA 38 (L) 09/17/2022   GFRAA 39 (L) 12/12/2013   CALCIUM 8.5 (L) 09/17/2022   PHOS 2.7 09/15/2022   PROT 5.8 (L) 09/15/2022   ALBUMIN 3.2 (L) 09/15/2022   BILITOT 0.5 09/15/2022   ALKPHOS 67 09/15/2022   AST 15  09/15/2022   ALT 11 09/15/2022   ANIONGAP 7 09/17/2022    CBG (  last 3)  No results for input(s): "GLUCAP" in the last 72 hours.    Coagulation Profile: Recent Labs  Lab 09/15/22 1420  INR 1.3*      Radiology Studies: NM Pulmonary Perfusion  Result Date: 09/16/2022 CLINICAL DATA:  Chest pain, nonspecific, DVT on chronic anticoagulation EXAM: NUCLEAR MEDICINE PERFUSION LUNG SCAN TECHNIQUE: Perfusion images were obtained in multiple projections after intravenous injection of radiopharmaceutical. Ventilation scans intentionally deferred if perfusion scan and chest x-ray adequate for interpretation during COVID 19 epidemic. RADIOPHARMACEUTICALS:  4.4 mCi Tc-39m MAA IV COMPARISON:  None Available. FINDINGS: There is a uniform distribution of radiotracer within the lungs bilaterally. No significant perfusion defects are identified. IMPRESSION: Normal exam; no evidence of pulmonary embolism. Electronically Signed   By: Helyn Numbers M.D.   On: 09/16/2022 20:54   DG CHEST PORT 1 VIEW  Result Date: 09/16/2022 CLINICAL DATA:  Epigastric pain. EXAM: PORTABLE CHEST 1 VIEW COMPARISON:  09/14/2022 and older exams. FINDINGS: Cardiac silhouette is normal in size. Bilateral hilar prominence, right greater than left, is presumed to be vascular accentuated by the semi-erect technique. No convincing mediastinal masses. Clear lungs.  No pleural effusion or pneumothorax. Skeletal structures are grossly intact. IMPRESSION: No active disease. Electronically Signed   By: Amie Portland M.D.   On: 09/16/2022 08:07   CT ABDOMEN PELVIS WO CONTRAST  Result Date: 09/15/2022 CLINICAL DATA:  Epigastric abdominal pain EXAM: CT ABDOMEN AND PELVIS WITHOUT CONTRAST TECHNIQUE: Multidetector CT imaging of the abdomen and pelvis was performed following the standard protocol without IV contrast. RADIATION DOSE REDUCTION: This exam was performed according to the departmental dose-optimization program which includes automated  exposure control, adjustment of the mA and/or kV according to patient size and/or use of iterative reconstruction technique. COMPARISON:  None Available. FINDINGS: Lower chest: No acute abnormality. Hepatobiliary: No focal liver abnormality is seen. Status post cholecystectomy. No biliary dilatation. Pancreas: Unremarkable Spleen: Unremarkable Adrenals/Urinary Tract: The adrenal glands are unremarkable. The kidneys are normal in position. Moderate bilateral renal cortical atrophy. And exophytic 20 mm lesion is seen arising from the interpolar region of the right kidney that was better assessed on prior sonogram of 07/26/2021 and is compatible with a hyperdense cortical cyst. Smaller simple cortical cyst arises from the upper pole the right kidney. No follow-up imaging is recommended for these lesions. The kidneys are otherwise unremarkable. The bladder is unremarkable. Stomach/Bowel: Stomach is within normal limits. Appendix appears normal. No evidence of bowel wall thickening, distention, or inflammatory changes. Vascular/Lymphatic: Aortic atherosclerosis. No enlarged abdominal or pelvic lymph nodes. Reproductive: Status post hysterectomy. No adnexal masses. Other: No abdominal wall hernia or abnormality. No abdominopelvic ascites. Musculoskeletal: Remote healed fracture deformity of the left ischium. Remote severe anterior wedge compression deformity of L1 with 80% loss of height and minimal retropulsion of the posterosuperior aspect of the vertebral body by 3 mm. Remote superior endplate fracture of L3. Degenerative changes are seen within the lumbar spine. Bilateral laminectomy and posterior decompression of L5-S1. IMPRESSION: 1. No acute intra-abdominal pathology identified. No definite radiographic explanation for the patient's reported symptoms. 2. Moderate bilateral renal cortical atrophy. 3. Remote severe anterior wedge compression deformity of L1 with 80% loss of height and minimal retropulsion of the  posterosuperior aspect of the vertebral body by 3 mm. Remote superior endplate fracture of L3. Remote healed fracture deformity of the left ischium. No acute bone abnormality. Aortic Atherosclerosis (ICD10-I70.0). Electronically Signed   By: Helyn Numbers M.D.   On: 09/15/2022 21:17   VAS  [place] LOWER EXTREMITY VENOUS (DVT)  Result Date: 09/15/2022  Lower Venous DVT Study Patient Name:  MELENA DOVEY  Date of Exam:   09/15/2022 Medical Rec #: [id number]        Accession #:    [id number] Date of Birth: [birth year]        Patient Gender: F Patient Age:   63 years Exam Location:  [facility] Procedure:      VAS [place] LOWER EXTREMITY VENOUS (DVT) Referring Phys: Enid Derry HALL --------------------------------------------------------------------------------  Indications: Elevated D-Dimer, and Pain.  Limitations: Body habitus and Edema. Comparison Study: No prior study on file Performing Technologist: Sherren Kerns RVS  Examination Guidelines: A complete evaluation includes B-mode imaging, spectral Doppler, color Doppler, and power Doppler as needed of all accessible portions of each vessel. Bilateral testing is considered an integral part of a complete examination. Limited examinations for reoccurring indications may be performed as noted. The reflux portion of the exam is performed with the patient in reverse Trendelenburg.  +---------+---------------+---------+-----------+----------+-------------------+ RIGHT    CompressibilityPhasicitySpontaneityPropertiesThrombus Aging      +---------+---------------+---------+-----------+----------+-------------------+ CFV      Full           Yes      Yes                                      +---------+---------------+---------+-----------+----------+-------------------+ SFJ      Full                                                             +---------+---------------+---------+-----------+----------+-------------------+ FV Prox  Full                                                              +---------+---------------+---------+-----------+----------+-------------------+ FV Mid   Full           Yes      Yes                                      +---------+---------------+---------+-----------+----------+-------------------+ FV DistalFull                                                             +---------+---------------+---------+-----------+----------+-------------------+ PFV      Full                                                             +---------+---------------+---------+-----------+----------+-------------------+ POP      Full           Yes      Yes                                      +---------+---------------+---------+-----------+----------+-------------------+  PTV                                                   Not well visualized +---------+---------------+---------+-----------+----------+-------------------+ PERO                                                  Not well visualized +---------+---------------+---------+-----------+----------+-------------------+   +---------+---------------+---------+-----------+----------+-------------------+ LEFT     CompressibilityPhasicitySpontaneityPropertiesThrombus Aging      +---------+---------------+---------+-----------+----------+-------------------+ CFV      Full           Yes      Yes                                      +---------+---------------+---------+-----------+----------+-------------------+ SFJ      Full                                                             +---------+---------------+---------+-----------+----------+-------------------+ FV Prox  Full                                                             +---------+---------------+---------+-----------+----------+-------------------+ FV Mid   Full           Yes      Yes                                       +---------+---------------+---------+-----------+----------+-------------------+ FV DistalFull                                                             +---------+---------------+---------+-----------+----------+-------------------+ PFV      Full                                                             +---------+---------------+---------+-----------+----------+-------------------+ POP      Full           Yes      Yes                                      +---------+---------------+---------+-----------+----------+-------------------+ PTV  Not well visualized +---------+---------------+---------+-----------+----------+-------------------+ PERO                                                  Not well visualized +---------+---------------+---------+-----------+----------+-------------------+     Summary: RIGHT: - There is no evidence of deep vein thrombosis in the lower extremity. However, portions of this examination were limited- see technologist comments above.  - No cystic structure found in the popliteal fossa.  LEFT: - There is no evidence of deep vein thrombosis in the lower extremity. However, portions of this examination were limited- see technologist comments above.  - No cystic structure found in the popliteal fossa.  *See table(s) above for measurements and observations. Electronically signed by Coral Else MD on 09/15/2022 at 3:04:49 PM.    Final        Kathlen Mody M.D. [facility] 09/17/2022, 10:18 AM  Available via Epic secure chat 7am-7pm After 7 pm, please refer to night coverage provider listed on amion.

## 2022-09-17 NOTE — Evaluation (Signed)
Physical Therapy Evaluation Patient Details Name: Maria Henry MRN: [id number] DOB: [birth year] Today's Date: 09/17/2022  History of Present Illness  82 y.o. female , presented to [facility] ED 09/15/22 due to shoulder pain and epigastric pain. Hgb 6.9 received 1x PRBC and admitted for acute on chronic normocytic anemia. PMH: obesity, autoimmune hepatitis, OSA, recent history of DVT on Eliquis since January 2024, hypothyroidism, hypertension, hyperlipidemia, stroke, CKD 3B  Clinical Impression  Pt currently oriented to herself and know she is in the hospital but does not know where. PTA pt reports living with her friend and husband in single story home with steps to enter. Pt reports friend is there most of the time but does leave her alone to go to store and run errands. Pt is very preoccupied with her current state of incontinence. Reporting she can not get out of bed because she will be incontinent. Once convinced that she can get to the [facility] pt requires min A for bed mobility and transfers to [facility] and then from [facility] to recliner. Pt limited in safe mobility by decreased safety awareness in presence of decreased strength and balance. PT will continue to follow acutely and will refer to Mobility Specialist.      Recommendations for follow up therapy are one component of a multi-disciplinary discharge planning process, led by the attending physician.  Recommendations may be updated based on patient status, additional functional criteria and insurance authorization.     Assistance Recommended at Discharge Frequent or constant Supervision/Assistance  Patient can return home with the following  A little help with walking and/or transfers;A little help with bathing/dressing/bathroom;Assistance with cooking/housework;Direct supervision/assist for medications management;Direct supervision/assist for financial management;Assist for transportation;Help with stairs or ramp for entrance    Equipment Recommendations  BSC/3in1  Recommendations for Other Services  OT consult    Functional Status Assessment Patient has had a recent decline in their functional status and demonstrates the ability to make significant improvements in function in a reasonable and predictable amount of time.     Precautions / Restrictions Precautions Precautions: Fall Restrictions Weight Bearing Restrictions: No      Mobility  Bed Mobility Overal bed mobility: Needs Assistance Bed Mobility: Supine to Sit     Supine to sit: HOB elevated, Min guard     General bed mobility comments: min guard for safety and vc for sequencing and not pulling on BSC to scoot hips to EoB    Transfers Overall transfer level: Needs assistance Equipment used: Rolling walker (2 wheels) Transfers: Sit to/from Stand, Bed to chair/wheelchair/BSC Sit to Stand: Min assist   Step pivot transfers: Min assist       General transfer comment: minA for power up to standing, vc for hand placement on RW and not RW and BSC, min A for steadying with sit<>stand from bed and from [facility] and for pivot from bed to [facility] and BSC to recliner    Ambulation/Gait               General Gait Details: pt refuses due to fear of incontinence        Balance Overall balance assessment: Needs assistance Sitting-balance support: Feet supported, No upper extremity supported Sitting balance-Leahy Scale: Fair     Standing balance support: Reliant on assistive device for balance Standing balance-Leahy Scale: Poor                               Pertinent Vitals/Pain  Pain Assessment Pain Assessment: Faces Faces Pain Scale: No hurt    Home Living Family/patient expects to be discharged to:: Private residence Living Arrangements: Non-relatives/Friends Available Help at Discharge: Friend(s) Type of Home: House Home Access: Stairs to enter Entrance Stairs-Rails: Right;Left;Can reach both Secretary/administrator of Steps: 6-7   Home Layout:  One level Home Equipment: Agricultural consultant (2 wheels)      Prior Function Prior Level of Function : Needs assist             Mobility Comments: ambulates in home with RW ADLs Comments: does bathing and dressing, friend does meds management and other iADLs        Extremity/Trunk Assessment   Upper Extremity Assessment Upper Extremity Assessment: Overall WFL for tasks assessed    Lower Extremity Assessment Lower Extremity Assessment: Overall WFL for tasks assessed    Cervical / Trunk Assessment Cervical / Trunk Assessment: Kyphotic  Communication   Communication: HOH  Cognition Arousal/Alertness: Awake/alert Behavior During Therapy: Flat affect Overall Cognitive Status: Impaired/Different from baseline Area of Impairment: Orientation, Memory, Following commands, Problem solving                 Orientation Level: Disoriented to, Place, Time, Situation   Memory: Decreased short-term memory Following Commands: Follows one step commands with increased time, Follows multi-step commands with increased time     Problem Solving: Slow processing, Requires verbal cues, Requires tactile cues General Comments: perseverating on incontinence, and inability to get to [facility] in time, refuses to have liquid breakfast because "it'll go right through me"        General Comments General comments (skin integrity, edema, etc.): BP 138/49, SpO2 >90%O2 on RA    Exercises     Assessment/Plan    PT Assessment Patient needs continued PT services  PT Problem List Decreased strength;Decreased activity tolerance;Decreased balance;Decreased mobility;Decreased coordination;Decreased cognition;Decreased knowledge of use of DME;Decreased safety awareness       PT Treatment Interventions DME instruction;Gait training;Stair training;Functional mobility training;Therapeutic activities;Therapeutic exercise;Balance training    PT Goals (Current goals can be found in the Care Plan section)   Acute Rehab PT Goals Patient Stated Goal: find a doctor to cure her incontinence PT Goal Formulation: Patient unable to participate in goal setting Time For Goal Achievement: 10/01/22 Potential to Achieve Goals: Fair    Frequency Min 1X/week        AM-PAC PT "6 Clicks" Mobility  Outcome Measure Help needed turning from your back to your side while in a flat bed without using bedrails?: None Help needed moving from lying on your back to sitting on the side of a flat bed without using bedrails?: A Little Help needed moving to and from a bed to a chair (including a wheelchair)?: A Little Help needed standing up from a chair using your arms (e.g., wheelchair or bedside chair)?: A Little Help needed to walk in hospital room?: A Little Help needed climbing 3-5 steps with a railing? : A Lot 6 Click Score: 18    End of Session   Activity Tolerance: Patient tolerated treatment well Patient left: in chair;with call bell/phone within reach;with chair alarm set Nurse Communication: Mobility status PT Visit Diagnosis: Unsteadiness on feet (R26.81);Other abnormalities of gait and mobility (R26.89);Muscle weakness (generalized) (M62.81);Difficulty in walking, not elsewhere classified (R26.2)    Time: 1610-9604 PT Time Calculation (min) (ACUTE ONLY): 18 min   Charges:   PT Evaluation $PT Eval Moderate Complexity: 1 Mod  Kastin Cerda B. Beverely Risen PT, DPT Acute Rehabilitation Services Please use secure chat or  Call Office [phone]   Elon Alas [facility] 09/17/2022, 8:51 AM

## 2022-09-17 NOTE — Evaluation (Signed)
Occupational Therapy Evaluation Patient Details Name: Maria Henry MRN: [id number] DOB: 05-17-1940 Today's Date: 09/17/2022   History of Present Illness 82 y.o. female , presented to [facility] ED 09/15/22 due to shoulder pain and epigastric pain. Hgb 6.9 received 1x PRBC and admitted for acute on chronic normocytic anemia. PMH: obesity, autoimmune hepatitis, OSA, recent history of DVT on Eliquis since January 2024, hypothyroidism, hypertension, hyperlipidemia, stroke, CKD 3B   Clinical Impression   Pt reports independence at baseline with ADLs, friend whom she lives with assists with IADLs including transportation and med mgmt. Pt uses RW for mobility. Pt currently needing set up -mod A for ADLs, and min A for transfers with RW. VSS on RA throughout session. Pt presenting with impairments listed below, will follow acutely. Recommend HHOT at d/c.      Recommendations for follow up therapy are one component of a multi-disciplinary discharge planning process, led by the attending physician.  Recommendations may be updated based on patient status, additional functional criteria and insurance authorization.   Assistance Recommended at Discharge Intermittent Supervision/Assistance  Patient can return home with the following A little help with walking and/or transfers;A lot of help with bathing/dressing/bathroom;Direct supervision/assist for medications management;Direct supervision/assist for financial management;Assist for transportation;Help with stairs or ramp for entrance;Assistance with cooking/housework    Functional Status Assessment  Patient has had a recent decline in their functional status and demonstrates the ability to make significant improvements in function in a reasonable and predictable amount of time.  Equipment Recommendations  None recommended by OT (pt has all needed DME)    Recommendations for Other Services PT consult     Precautions / Restrictions Precautions Precautions:  Fall Restrictions Weight Bearing Restrictions: No      Mobility Bed Mobility               General bed mobility comments: OOB in chair upon arrival and departure    Transfers Overall transfer level: Needs assistance Equipment used: Rolling walker (2 wheels) Transfers: Sit to/from Stand, Bed to chair/wheelchair/BSC Sit to Stand: Min assist     Step pivot transfers: Min assist     General transfer comment: minA for power up to standing, vc for hand placement on RW and not RW and BSC, min A for steadying with sit<>stand from bed and from [facility] and for pivot from bed to [facility] and BSC to recliner      Balance Overall balance assessment: Needs assistance Sitting-balance support: Feet supported, No upper extremity supported Sitting balance-Leahy Scale: Fair     Standing balance support: Reliant on assistive device for balance Standing balance-Leahy Scale: Poor                             ADL either performed or assessed with clinical judgement   ADL Overall ADL's : Needs assistance/impaired Eating/Feeding: Set up;Sitting   Grooming: Standing;Min guard   Upper Body Bathing: Minimal assistance;Sitting   Lower Body Bathing: Moderate assistance;Sitting/lateral leans   Upper Body Dressing : Minimal assistance;Sitting   Lower Body Dressing: Moderate assistance;Sitting/lateral leans   Toilet Transfer: Minimal assistance;Stand-pivot;BSC/3in1;Rolling walker (2 wheels)   Toileting- Clothing Manipulation and Hygiene: Supervision/safety;Sit to/from stand       Functional mobility during ADLs: Minimal assistance;Rolling walker (2 wheels)       Vision   Vision Assessment?: No apparent visual deficits     Perception Perception Perception Tested?: No   Praxis Praxis Praxis tested?: Not tested  Pertinent Vitals/Pain Pain Assessment Pain Assessment: No/denies pain     Hand Dominance Right   Extremity/Trunk Assessment Upper Extremity  Assessment Upper Extremity Assessment: Generalized weakness   Lower Extremity Assessment Lower Extremity Assessment: Defer to PT evaluation   Cervical / Trunk Assessment Cervical / Trunk Assessment: Kyphotic   Communication Communication Communication: HOH   Cognition Arousal/Alertness: Awake/alert Behavior During Therapy: Flat affect Overall Cognitive Status: Impaired/Different from baseline Area of Impairment: Orientation, Memory, Following commands, Problem solving                 Orientation Level: Situation   Memory: Decreased short-term memory Following Commands: Follows one step commands with increased time, Follows multi-step commands with increased time     Problem Solving: Slow processing, Requires verbal cues, Requires tactile cues       General Comments  SpO2 above 90% on RA throughout session    Exercises     Shoulder Instructions      Home Living Family/patient expects to be discharged to:: Private residence Living Arrangements: Non-relatives/Friends Available Help at Discharge: Friend(s) Type of Home: House Home Access: Stairs to enter [employer] of Steps: 6-7 Entrance Stairs-Rails: Right;Left;Can reach both Home Layout: One level     Bathroom Shower/Tub: Producer, television/film/video: Standard     Home Equipment: Agricultural consultant (2 wheels);Shower seat - built in;Grab bars - tub/shower;BSC/3in1          Prior Functioning/Environment Prior Level of Function : Needs assist             Mobility Comments: ambulates in home with RW ADLs Comments: does bathing and dressing, friend does meds management and other iADLs, per visitor pt stays in chair and "watches westerns all day"        OT Problem List: Decreased strength;Decreased activity tolerance;Decreased range of motion;Impaired balance (sitting and/or standing);Decreased cognition      OT Treatment/Interventions: Therapeutic exercise;Self-care/ADL  training;Energy conservation;DME and/or AE instruction;Therapeutic activities;Balance training;Patient/family education;Cognitive remediation/compensation    OT Goals(Current goals can be found in the care plan section) Acute Rehab OT Goals Patient Stated Goal: none stated OT Goal Formulation: With patient Time For Goal Achievement: 10/01/22 Potential to Achieve Goals: Good ADL Goals Pt Will Perform Upper Body Dressing: with supervision;sitting Pt Will Perform Lower Body Dressing: with supervision;sit to/from stand;sitting/lateral leans Pt Will Transfer to Toilet: with supervision;ambulating;regular height toilet Additional ADL Goal #1: pt will complete bed mobility min guard A in prep for ADLs  OT Frequency: Min 1X/week    Co-evaluation              AM-PAC OT "6 Clicks" Daily Activity     Outcome Measure Help from another person eating meals?: None Help from another person taking care of personal grooming?: A Little Help from another person toileting, which includes using toliet, bedpan, or urinal?: A Little Help from another person bathing (including washing, rinsing, drying)?: A Lot Help from another person to put on and taking off regular upper body clothing?: A Little Help from another person to put on and taking off regular lower body clothing?: A Lot 6 Click Score: 17   End of Session Equipment Utilized During Treatment: Gait belt;Rolling walker (2 wheels) Nurse Communication: Mobility status  Activity Tolerance: Patient tolerated treatment well Patient left: in chair;with call bell/phone within reach;with family/visitor present;Other (comment) (chair alarm needing new batteries, RN notified and pt verbalized understanding to press call bell before getting up)  OT Visit Diagnosis: Unsteadiness on feet (R26.81);Other abnormalities  of gait and mobility (R26.89);Muscle weakness (generalized) (M62.81)                Time: 1334-1400 OT Time Calculation (min): 26  min Charges:  OT General Charges $OT Visit: 1 Visit OT Evaluation $OT Eval Moderate Complexity: 1 Mod OT Treatments $Self Care/Home Management : 8-22 mins  Carver Fila, OTD, OTR/L SecureChat Preferred Acute Rehab [phone]   Carver Fila Koonce 09/17/2022, 4:29 PM

## 2022-09-18 ENCOUNTER — Encounter (HOSPITAL_COMMUNITY)
Admission: EM | Disposition: A | Discharge status: Home Health | Payer: Self-pay | Source: Home / Self Care | Attending: Internal Medicine

## 2022-09-18 ENCOUNTER — Inpatient Hospital Stay (HOSPITAL_COMMUNITY): Payer: Medicare PPO | Admitting: Anesthesiology

## 2022-09-18 ENCOUNTER — Encounter (HOSPITAL_COMMUNITY): Payer: Self-pay | Admitting: Internal Medicine

## 2022-09-18 DIAGNOSIS — D509 Iron deficiency anemia, unspecified: Secondary | ICD-10-CM | POA: Diagnosis not present

## 2022-09-18 DIAGNOSIS — I8510 Secondary esophageal varices without bleeding: Secondary | ICD-10-CM | POA: Diagnosis not present

## 2022-09-18 DIAGNOSIS — K3189 Other diseases of stomach and duodenum: Secondary | ICD-10-CM

## 2022-09-18 DIAGNOSIS — K317 Polyp of stomach and duodenum: Secondary | ICD-10-CM

## 2022-09-18 DIAGNOSIS — K7460 Unspecified cirrhosis of liver: Secondary | ICD-10-CM | POA: Diagnosis not present

## 2022-09-18 DIAGNOSIS — G4730 Sleep apnea, unspecified: Secondary | ICD-10-CM

## 2022-09-18 DIAGNOSIS — D649 Anemia, unspecified: Secondary | ICD-10-CM | POA: Diagnosis not present

## 2022-09-18 DIAGNOSIS — K2970 Gastritis, unspecified, without bleeding: Secondary | ICD-10-CM

## 2022-09-18 DIAGNOSIS — K754 Autoimmune hepatitis: Secondary | ICD-10-CM | POA: Diagnosis not present

## 2022-09-18 DIAGNOSIS — R1013 Epigastric pain: Secondary | ICD-10-CM | POA: Diagnosis not present

## 2022-09-18 HISTORY — PX: ESOPHAGOGASTRODUODENOSCOPY (EGD) WITH PROPOFOL: SHX5813

## 2022-09-18 HISTORY — PX: BIOPSY: SHX5522

## 2022-09-18 LAB — CBC
HCT: 33 % — ABNORMAL LOW (ref 36.0–46.0)
Hemoglobin: 10.5 g/dL — ABNORMAL LOW (ref 12.0–15.0)
MCH: 29.9 pg (ref 26.0–34.0)
MCHC: 31.8 g/dL (ref 30.0–36.0)
MCV: 94 fL (ref 80.0–100.0)
Platelets: 201 10*3/uL (ref 150–400)
RBC: 3.51 MIL/uL — ABNORMAL LOW (ref 3.87–5.11)
RDW: 17.3 % — ABNORMAL HIGH (ref 11.5–15.5)
WBC: 4.3 10*3/uL (ref 4.0–10.5)
nRBC: 0 % (ref 0.0–0.2)

## 2022-09-18 LAB — PROTEIN S, TOTAL: Protein S Ag, Total: 86 % (ref 60–150)

## 2022-09-18 LAB — HOMOCYSTEINE: Homocysteine: 14.1 umol/L (ref 0.0–21.3)

## 2022-09-18 LAB — PROTEIN S ACTIVITY: Protein S Activity: 85 % (ref 63–140)

## 2022-09-18 LAB — LUPUS ANTICOAGULANT PANEL
DRVVT: 34.5 s (ref 0.0–47.0)
PTT Lupus Anticoagulant: 39.8 s (ref 0.0–43.5)

## 2022-09-18 LAB — PROTEIN C ACTIVITY: Protein C Activity: 118 % (ref 73–180)

## 2022-09-18 LAB — AFP TUMOR MARKER: AFP, Serum, Tumor Marker: 2.2 ng/mL (ref 0.0–8.7)

## 2022-09-18 SURGERY — ESOPHAGOGASTRODUODENOSCOPY (EGD) WITH PROPOFOL
Anesthesia: Monitor Anesthesia Care

## 2022-09-18 MED ORDER — RENA-VITE PO TABS: 1.0000 | ORAL_TABLET | Freq: Every day | ORAL | Status: DC

## 2022-09-18 MED ORDER — PANTOPRAZOLE SODIUM 40 MG PO TBEC
40.0000 mg | DELAYED_RELEASE_TABLET | Freq: Every day | ORAL | Status: DC
  Administered 2022-09-18: 40 mg via ORAL
  Filled 2022-09-18: qty 1

## 2022-09-18 MED ORDER — ENSURE ENLIVE PO LIQD
237.0000 mL | Freq: Two times a day (BID) | ORAL | Status: DC
  Administered 2022-09-18: 237 mL via ORAL

## 2022-09-18 MED ORDER — PROPOFOL 500 MG/50ML IV EMUL
INTRAVENOUS | Status: DC | PRN
  Administered 2022-09-18: 100 ug/kg/min via INTRAVENOUS

## 2022-09-18 MED ORDER — ENSURE ENLIVE PO LIQD: 237.0000 mL | Freq: Two times a day (BID) | ORAL | 2 refills | Status: AC

## 2022-09-18 MED ORDER — LEVOTHYROXINE SODIUM 100 MCG PO TABS
100.0000 ug | ORAL_TABLET | Freq: Every day | ORAL | Status: DC
  Administered 2022-09-18: 100 ug via ORAL
  Filled 2022-09-18: qty 1

## 2022-09-18 MED ORDER — LACTATED RINGERS IV SOLN: INTRAVENOUS | Status: DC

## 2022-09-18 MED ORDER — ELIQUIS 5 MG PO TABS: 5.0000 mg | ORAL_TABLET | Freq: Two times a day (BID) | ORAL | Status: AC

## 2022-09-18 SURGICAL SUPPLY — 15 items

## 2022-09-18 NOTE — Progress Notes (Signed)
Went over discharge paper work with patient and Ezequiel Ganser. All questions answered. PIV and telemetry removed. All belongings at bedside.

## 2022-09-18 NOTE — Anesthesia Postprocedure Evaluation (Signed)
Anesthesia Post Note  Patient: Maria Henry  Procedure(s) Performed: ESOPHAGOGASTRODUODENOSCOPY (EGD) WITH PROPOFOL BIOPSY     Patient location during evaluation: PACU Anesthesia Type: MAC Level of consciousness: awake and alert Pain management: pain level controlled Vital Signs Assessment: post-procedure vital signs reviewed and stable Respiratory status: spontaneous breathing, nonlabored ventilation and respiratory function stable Cardiovascular status: blood pressure returned to baseline and stable Postop Assessment: no apparent nausea or vomiting Anesthetic complications: no   No notable events documented.  Last Vitals:  Vitals:   09/18/22 1019 09/18/22 1025  BP: (!) 154/63 (!) 143/55  Pulse: 64 (!) 57  Resp: 14 11  Temp:    SpO2: 93% 93%    Last Pain:  Vitals:   09/18/22 1025  TempSrc:   PainSc: 0-No pain                 Lowella Curb

## 2022-09-18 NOTE — TOC Transition Note (Addendum)
Transition of Care [facility]) - CM/SW Discharge Note   Patient Details  Name: Maria Henry MRN: [id number] Date of Birth: 1940-06-13  Transition of Care [facility]) CM/SW Contact:  Leone Haven, RN Phone Number: 09/18/2022, 1:48 PM   Clinical Narrative:    Patient is for dc today, she is already set up with [facility] for [facility] services by previous NCM.  This NCM notified Cory with [facility] of dc today. NCM asked patient if she wanted a BSC she states no, she uses a walker at home to help her get to the bathroom.     Barriers to Discharge: Continued Medical Work up   Patient Goals and CMS Choice CMS Medicare.gov Compare Post Acute Care list provided to:: Patient Represenative (must comment) Choice offered to / list presented to : [facility] POA / Guardian  Discharge Placement                         Discharge Plan and Services Additional resources added to the After Visit Summary for       Post Acute Care Choice: Home Health                    HH Arranged: PT, OT, Nurse's Aide HH Agency: [facility] Date [facility] Agency Contacted: 09/17/22 Time HH Agency Contacted: 1527 Representative spoke with at [facility] Agency: Kandee Keen  Social Determinants of Health (SDOH) Interventions SDOH Screenings   Food Insecurity: No Food Insecurity (09/15/2022)  Housing: Low Risk  (09/15/2022)  Transportation Needs: No Transportation Needs (09/15/2022)  Utilities: Not At Risk (09/15/2022)  Tobacco Use: Low Risk  (09/18/2022)     Readmission Risk Interventions     No data to display

## 2022-09-18 NOTE — Anesthesia Preprocedure Evaluation (Signed)
Anesthesia Evaluation  Patient identified by MRN, date of birth, ID band Patient awake    Reviewed: Allergy & Precautions, H&P , NPO status , Patient's Chart, lab work & pertinent test results  Airway Mallampati: II  TM Distance: >3 FB Neck ROM: Full    Dental no notable dental hx. (+) Edentulous Upper, Edentulous Lower, Dental Advisory Given   Pulmonary sleep apnea    Pulmonary exam normal breath sounds clear to auscultation       Cardiovascular negative cardio ROS Normal cardiovascular exam Rhythm:Regular Rate:Normal     Neuro/Psych  Headaches PSYCHIATRIC DISORDERS  Depression   Dementia CVA, Residual Symptoms    GI/Hepatic negative GI ROS, Neg liver ROS,GERD  Medicated and Controlled,,  Endo/Other  negative endocrine ROSHypothyroidism    Renal/GU Renal InsufficiencyRenal disease     Musculoskeletal  (+) Arthritis , Osteoarthritis,    Abdominal  (+) + obese  Peds  Hematology  (+) Blood dyscrasia, anemia   Anesthesia Other Findings   Reproductive/Obstetrics negative OB ROS                             Anesthesia Physical Anesthesia Plan  ASA: 3  Anesthesia Plan: MAC   Post-op Pain Management: Minimal or no pain anticipated   Induction: Intravenous  PONV Risk Score and Plan: 2 and Ondansetron, Midazolam and Treatment may vary due to age or medical condition  Airway Management Planned: Nasal Cannula  Additional Equipment:   Intra-op Plan:   Post-operative Plan:   Informed Consent: I have reviewed the patients History and Physical, chart, labs and discussed the procedure including the risks, benefits and alternatives for the proposed anesthesia with the patient or authorized representative who has indicated his/her understanding and acceptance.     Dental advisory given  Plan Discussed with: CRNA  Anesthesia Plan Comments:         Anesthesia Quick Evaluation

## 2022-09-18 NOTE — Transfer of Care (Signed)
Immediate Anesthesia Transfer of Care Note  Patient: Maria Henry  Procedure(s) Performed: ESOPHAGOGASTRODUODENOSCOPY (EGD) WITH PROPOFOL BIOPSY  Patient Location: Endoscopy Unit  Anesthesia Type:MAC  Level of Consciousness: drowsy  Airway & Oxygen Therapy: Patient Spontanous Breathing and Patient connected to nasal cannula oxygen  Post-op Assessment: Report given to RN and Post -op Vital signs reviewed and stable  Post vital signs: Reviewed and stable  Last Vitals:  Vitals Value Taken Time  BP 104/50 09/18/22 1004  Temp    Pulse 58 09/18/22 1004  Resp 13 09/18/22 1004  SpO2 93 % 09/18/22 1004  Vitals shown include unvalidated device data.  Last Pain:  Vitals:   09/18/22 0911  TempSrc: Tympanic  PainSc: 0-No pain      Patients Stated Pain Goal: 0 (09/17/22 0409)  Complications: No notable events documented.

## 2022-09-18 NOTE — Interval H&P Note (Signed)
History and Physical Interval Note: For EGD today to evaluate acute on chronic low iron anemia and history of autoimmune hepatitis with cirrhosis HIGHER THAN BASELINE RISK.The nature of the procedure, as well as the risks, benefits, and alternatives were carefully and thoroughly reviewed with the patient. Ample time for discussion and questions allowed. The patient understood, was satisfied, and agreed to proceed.   Lab Results  Component Value Date   INR 1.3 (H) 09/15/2022   INR 0.95 12/04/2013   INR 0.89 09/08/2012      Latest Ref Rng & Units 09/18/2022   12:16 AM 09/17/2022   12:16 AM 09/16/2022    9:53 AM  CBC  WBC 4.0 - 10.5 K/uL 4.3  4.3  4.2   Hemoglobin 12.0 - 15.0 g/dL 82.9  9.5  7.0   Hematocrit 36.0 - 46.0 % 33.0  30.3  21.5   Platelets 150 - 400 K/uL 201  204  182      09/18/2022 9:36 AM  Ramond Craver  has presented today for surgery, with the diagnosis of anemia, history of cirrhosis.  The various methods of treatment have been discussed with the patient and family. After consideration of risks, benefits and other options for treatment, the patient has consented to  Procedure(s): ESOPHAGOGASTRODUODENOSCOPY (EGD) WITH PROPOFOL (N/A) as a surgical intervention.  The patient's history has been reviewed, patient examined, no change in status, stable for surgery.  I have reviewed the patient's chart and labs.  Questions were answered to the patient's satisfaction.     Carie Caddy Quantae Martel

## 2022-09-18 NOTE — Op Note (Signed)
[facility] Patient Name: Maria Henry Procedure Date : 09/18/2022 MRN: [id number] Attending MD: Beverley Fiedler , MD, [id number] Date of Birth: Aug 02, 1940 CSN: [id number] Age: 82 Admit Type: Inpatient Procedure:                Upper GI endoscopy Indications:              Iron deficiency anemia, Cirrhosis rule out                            esophageal varices Providers:                Carie Caddy. Rhea Belton, MD, Geralyn Corwin, RN, Harrington Challenger, Technician Referring MD:             [facility] Medicines:                Monitored Anesthesia Care Complications:            No immediate complications. Estimated Blood Loss:     Estimated blood loss was minimal. Procedure:                Pre-Anesthesia Assessment:                           - Prior to the procedure, a History and Physical                            was performed, and patient medications and                            allergies were reviewed. The patient's tolerance of                            previous anesthesia was also reviewed. The risks                            and benefits of the procedure and the sedation                            options and risks were discussed with the patient.                            All questions were answered, and informed consent                            was obtained. Prior Anticoagulants: The patient has                            taken Eliquis (apixaban), last dose was 3 days                            prior to procedure. ASA Grade Assessment: III - A  patient with severe systemic disease. After                            reviewing the risks and benefits, the patient was                            deemed in satisfactory condition to undergo the                            procedure.                           After obtaining informed consent, the endoscope was                            passed under direct vision.  Throughout the                            procedure, the patient's blood pressure, pulse, and                            oxygen saturations were monitored continuously. The                            [id number]-[id number] ([id number]) Olympus endoscope was introduced                            through the mouth, and advanced to the second part                            of duodenum. The upper GI endoscopy was                            accomplished without difficulty. The patient                            tolerated the procedure well. Scope In: Scope Out: Findings:      Small (< 5 mm) varices were found in the lower third of the esophagus.       They were diminutive in size.      Diffuse mildly erythematous mucosa without bleeding was found in the       gastric fundus, in the gastric body and in the gastric antrum. Biopsies       were taken with a cold forceps for histology and Helicobacter pylori       testing.      Multiple diminutive sessile polyps with no bleeding and no stigmata of       recent bleeding were found in the gastric fundus and in the gastric body.      The examined duodenum was normal. Impression:               - Very small (< 5 mm) esophageal varices. No                            stigmata of bleeding.                           -  Erythematous mucosa in the gastric fundus,                            gastric body and antrum. Biopsied.                           - Multiple small gastric polyps. Benign appearing                            and most consistent with fundic gland polyps.                           - No gastric varices.                           - Normal examined duodenum. Moderate Sedation:      N/A Recommendation:           - Return patient to hospital ward for ongoing care.                           - Advance diet as tolerated.                           - Continue present medications. Iron has been                            replaced IV. Follow-up Hgb and iron studies as  an                            outpatient.                           - Await pathology results.                           - No plan for inpatient colonoscopy.                           - Patient needs follow-up with her                            gastroenterology/hepatology team at [facility].                           - Inpatient GI team will sign off, call if                            questions. Procedure Code(s):        --- Professional ---                           [phone], Esophagogastroduodenoscopy, flexible,                            transoral; with biopsy, single or multiple Diagnosis Code(s):        --- Professional ---  K74.60, Unspecified cirrhosis of liver                           I85.10, Secondary esophageal varices without                            bleeding                           K31.89, Other diseases of stomach and duodenum                           K31.7, Polyp of stomach and duodenum                           D50.9, Iron deficiency anemia, unspecified CPT copyright 2022 American Medical Association. All rights reserved. The codes documented in this report are preliminary and upon coder review may  be revised to meet current compliance requirements. Beverley Fiedler, MD 09/18/2022 10:22:33 AM This report has been signed electronically. Number of Addenda: 0

## 2022-09-18 NOTE — Progress Notes (Signed)
Initial Nutrition Assessment  DOCUMENTATION CODES:   Not applicable  INTERVENTION:   - Ensure Enlive po BID, each supplement provides 350 kcal and 20 grams of protein  - Downgrade diet to dysphagia 3 with thin liquids as pt is edentulous  - Renal MVI daily given CKD stage IIIb  NUTRITION DIAGNOSIS:   Inadequate oral intake related to decreased appetite as evidenced by per patient/family report.  GOAL:   Patient will meet greater than or equal to 90% of their needs  MONITOR:   PO intake, Supplement acceptance, Labs, Weight trends, Skin  REASON FOR ASSESSMENT:   Consult Assessment of nutrition requirement/status  ASSESSMENT:   82 year old female who presented to the ED on 5/03 with shoulder pain. 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 Pt admitted with acute on chronic normocytic anemia, non anion gap metabolic acidosis.  05/04 - NPO 05/05 - clear liquids 05/07 - NPO, s/p EGD showing small varices and diffuse mildly erythematous mucosa without bleeding, dysphagia 3 diet  Pt was NPO this AM for EGD which has been completed. Diet advanced to 2 gram sodium.  Spoke with pt at bedside. She reports feeling disoriented this AM. She shares that she does not have much of an appetite right now but is thirsty. RN in room and going to get pt a drink.  Pt shares that she has a decreased appetite at baseline. Pt reports that she "nibbles" on and off throughout the day. When asked what she normally eats, pt reports that it is "junk." She likes to eat crackers.  Pt unsure of her UBW and whether she has lost any weight recently. Weight history in chart is limited. Last available weight PTA is from 2017. Current weight up 6 kg since that date.  Pt endorses issues chewing due to not having any teeth. Pt is amenable to diet down grade to dysphagia 3 for ease of  intake. Pt also willing to consume oral nutrition supplements. RD to order Ensure Enlive BID. Will also order daily renal MVI given CKD stage IIIb.  Medications reviewed and include: IV protonix  Labs reviewed: creatinine 1.38, folate 7.6, vitamin B12 197  I/O's: +1.5 L since admit  NUTRITION - FOCUSED PHYSICAL EXAM:  Flowsheet Row Most Recent Value  Orbital Region Mild depletion  Upper Arm Region No depletion  Thoracic and Lumbar Region No depletion  Buccal Region No depletion  Temple Region Mild depletion  Clavicle Bone Region Mild depletion  Clavicle and Acromion Bone Region Mild depletion  Scapular Bone Region No depletion  Dorsal Hand Mild depletion  Patellar Region No depletion  Anterior Thigh Region Mild depletion  Posterior Calf Region No depletion  Edema (RD Assessment) Mild  Hair Reviewed  Eyes Reviewed  Mouth Reviewed  Skin Reviewed  Nails Reviewed       Diet Order:   Diet Order             DIET DYS 3 Room service appropriate? Yes with Assist; Fluid consistency: Thin  Diet effective now                   EDUCATION NEEDS:   Not appropriate for education at this time  Skin:  Skin Assessment: Skin Integrity Issues: Stage I: sacrum  Last BM:  09/16/22 type 7  Height:   Ht Readings from Last 1 Encounters:  09/15/22 5\' 2"  (1.575 m)  Weight:   Wt Readings from Last 1 Encounters:  09/15/22 76.3 kg    BMI:  Body mass index is 30.77 kg/m.  Estimated Nutritional Needs:   Kcal:  1650-1850  Protein:  85-100 grams  Fluid:  1.6-1.8 L    Mertie Clause, MS, RD, LDN Inpatient Clinical Dietitian Please see AMiON for contact information.

## 2022-09-18 NOTE — Anesthesia Procedure Notes (Signed)
Procedure Name: MAC Date/Time: 09/18/2022 9:49 AM  Performed by: Randon Goldsmith, CRNAPre-anesthesia Checklist: Patient identified, Emergency Drugs available, Suction available and Patient being monitored Patient Re-evaluated:Patient Re-evaluated prior to induction Oxygen Delivery Method: Nasal cannula Preoxygenation: Pre-oxygenation with 100% oxygen Induction Type: IV induction

## 2022-09-18 NOTE — Discharge Summary (Signed)
Physician Discharge Summary   Patient: Maria Henry MRN: [id number] DOB: 11-18-1940  Admit date:     09/14/2022  Discharge date: 09/18/2022  Discharge Physician: Kathlen Mody   PCP: [name], MD   Recommendations at discharge:  Please follow up with GI as needed.  Recommend to follow up with PCP regarding hypercoagulable work up and decide if the eliquis need to be stopped.  Please follow up with PCP in one week.   Discharge Diagnoses: Principal Problem:   Acute on chronic anemia Active Problems:   Iron deficiency anemia   Autoimmune hepatitis (HCC)   Epigastric pain   Pressure injury of skin   Other cirrhosis of liver (HCC)   Gastritis without bleeding    Hospital Course:  82 y.o. female with medical history significant for obesity, autoimmune hepatitis, OSA, recent history of DVT on Eliquis since January 2024, hypothyroidism, hypertension, hyperlipidemia, stroke, CKD 3B, who initially presented to [facility] ED due to shoulder pain and epigastric pain.  She was found to be anemic, with heme negative stools. Assessment and Plan:    Epigastric pain, nausea, and diarrhea , mild abdominal pain Resolved.  EGD done and biopsies taken , recommend checking with GI regarding the biopsy results.          Heme negative stools and anemia  GI consulted, underwent EGD and results discussed with patient.  Anemia panel shows iron def anemia.  underwent IV iron prior to discharge.  Hemoglobin on admission was around 6.9 , ( baseline hemoglobin is around 9) underwent 1 unit of prbc transfusion and repeat hemoglobin improved to 7.7 further dropped to 7, again no signs of melena or hematemesis.  She underwent a total of 2 units of pRbc transfusions and repeat H&H improved.        Auto immune hepatitis with cirrhosis:  Continue with Imuran and follow up with Hepatology as scheduled .      H/o of DVT in 05/2022, unclear if it was provoked or unprovoked.  She has completed 4 months  of DOAC. Recommend restarting eliquis in 1 to 2 days. Hypercoagulable work up done, recommend checking the results with PCP and defer regarding stopping the eliquis in the future to PCP.  Discussed the above with the patient's HCPOA.      Mildly elevated d dimer and epigastric pain:  Troponin is negative.  Negative V/Q scan to check for PE.  She was initially started on IV heparin empirically, but stopped within an hour due to the drop in hemoglobin from 7.7 to 7.        Stage 3 b CKD;  Creatinine at baseline around 1.7.  Hemoglobin at 1.4 and stable.      Dementia:  Continue with namenda and aricept.        Hyperlipidemia:  Resume statin.      H/o CVA in the past:  Not on any anti platelet agents. On eliquis at home which is hold for possible gi bleed/ anemia.      Uncontrolled hypertension:  Improved.  Added hydralazine prn.        RN Pressure Injury Documentation:     Pressure Injury 09/15/22 Sacrum Mid Stage 1 -  Intact skin with non-blanchable redness of a localized area usually over a bony prominence. pink (Active)  09/15/22 0230  Location: Sacrum  Location Orientation: Mid  Staging: Stage 1 -  Intact skin with non-blanchable redness of a localized area usually over a bony prominence.  Wound Description (Comments): pink  Present on Admission: Yes  Dressing Type Foam - Lift dressing to assess site every shift 09/16/22 2207      Estimated body mass index is 30.77 kg/m as calculated from the following:   Height as of this encounter: 5\' 2"  (1.575 m).   Weight as of this encounter: 76.3 kg.      Consultants: gi Procedures performed: egd  Disposition: Home Diet recommendation:  Discharge Diet Orders (From admission, onward)     Start     Ordered   09/18/22 0000  Diet - low sodium heart healthy        09/18/22 1228           Cardiac diet DISCHARGE MEDICATION: Allergies as of 09/18/2022       Reactions   Morphine And Related    CAUSED NAUSEA  AND HALLUCINATIONS   Oxycodone    PT STATES OXYCODONE MAKES HER VERY SLEEPY        Medication List     STOP taking these medications    hydrALAZINE 25 MG tablet Commonly known as: APRESOLINE       TAKE these medications    amLODipine 10 MG tablet Commonly known as: NORVASC Take 10 mg by mouth daily.   atorvastatin 40 MG tablet Commonly known as: LIPITOR Take 40 mg by mouth at bedtime.   azaTHIOprine 50 MG tablet Commonly known as: IMURAN Take 25 mg by mouth daily.   calcium carbonate 750 MG chewable tablet Commonly known as: TUMS EX Chew 1 tablet by mouth as needed for heartburn.   cetirizine 10 MG tablet Commonly known as: ZYRTEC Take 10 mg by mouth at bedtime.   donepezil 10 MG tablet Commonly known as: ARICEPT Take 10 mg by mouth in the morning.   Eliquis 5 MG Tabs tablet Generic drug: apixaban Take 1 tablet (5 mg total) by mouth 2 (two) times daily. Start taking on: Sep 19, 2022   esomeprazole 40 MG capsule Commonly known as: NEXIUM Take 40 mg by mouth daily.   feeding supplement Liqd Take 237 mLs by mouth 2 (two) times daily between meals.   hyoscyamine 0.125 MG SL tablet Commonly known as: LEVSIN SL Take 0.125 mg by mouth 3 (three) times daily.   levothyroxine 100 MCG tablet Commonly known as: SYNTHROID Take 100 mcg by mouth every morning.   memantine 5 MG tablet Commonly known as: NAMENDA Take 5 mg by mouth 2 (two) times daily.   PRESERVISION AREDS 2 PO Take 1 tablet by mouth in the morning and at bedtime.   primidone 50 MG tablet Commonly known as: MYSOLINE Take 50 mg by mouth at bedtime.   traMADol 50 MG tablet Commonly known as: ULTRAM Take 1-2 tablets (50-100 mg total) by mouth every 6 (six) hours as needed for moderate pain.   traZODone 50 MG tablet Commonly known as: DESYREL Take 50 mg by mouth at bedtime.        Follow-up Information     [facility] Follow up.   Specialty: Home Health Services Why:  Home health has been arranged. They will contact you to schedule apt within 48hrs post discharge. Contact information: [address] [place] [address] [phone]         [name], MD. Schedule an appointment as soon as possible for a visit in 1 week(s).   Specialty: Family Medicine Contact information: [address] [place] [address] [phone]  Discharge Exam: Filed Weights   09/14/22 1806 09/15/22 0236  Weight: 70.3 kg 76.3 kg   General exam: Appears calm and comfortable  Respiratory system: Clear to auscultation. Respiratory effort normal. Cardiovascular system: S1 & S2 heard, RRR. No JVD,  Gastrointestinal system: Abdomen is nondistended, soft and nontender.  Central nervous system: Alert and oriented. No focal neurological deficits. Extremities: Symmetric 5 x 5 power. Skin: No rashes, lesions or ulcers Psychiatry: calm and co operative.    Condition at discharge: fair  The results of significant diagnostics from this hospitalization (including imaging, microbiology, ancillary and laboratory) are listed below for reference.   Imaging Studies: NM Pulmonary Perfusion  Result Date: 09/16/2022 CLINICAL DATA:  Chest pain, nonspecific, DVT on chronic anticoagulation EXAM: NUCLEAR MEDICINE PERFUSION LUNG SCAN TECHNIQUE: Perfusion images were obtained in multiple projections after intravenous injection of radiopharmaceutical. Ventilation scans intentionally deferred if perfusion scan and chest x-ray adequate for interpretation during COVID 19 epidemic. RADIOPHARMACEUTICALS:  4.4 mCi Tc-88m MAA IV COMPARISON:  None Available. FINDINGS: There is a uniform distribution of radiotracer within the lungs bilaterally. No significant perfusion defects are identified. IMPRESSION: Normal exam; no evidence of pulmonary embolism. Electronically Signed   By: Helyn Numbers M.D.   On: 09/16/2022 20:54   DG CHEST PORT 1  VIEW  Result Date: 09/16/2022 CLINICAL DATA:  Epigastric pain. EXAM: PORTABLE CHEST 1 VIEW COMPARISON:  09/14/2022 and older exams. FINDINGS: Cardiac silhouette is normal in size. Bilateral hilar prominence, right greater than left, is presumed to be vascular accentuated by the semi-erect technique. No convincing mediastinal masses. Clear lungs.  No pleural effusion or pneumothorax. Skeletal structures are grossly intact. IMPRESSION: No active disease. Electronically Signed   By: Amie Portland M.D.   On: 09/16/2022 08:07   CT ABDOMEN PELVIS WO CONTRAST  Result Date: 09/15/2022 CLINICAL DATA:  Epigastric abdominal pain EXAM: CT ABDOMEN AND PELVIS WITHOUT CONTRAST TECHNIQUE: Multidetector CT imaging of the abdomen and pelvis was performed following the standard protocol without IV contrast. RADIATION DOSE REDUCTION: This exam was performed according to the departmental dose-optimization program which includes automated exposure control, adjustment of the mA and/or kV according to patient size and/or use of iterative reconstruction technique. COMPARISON:  None Available. FINDINGS: Lower chest: No acute abnormality. Hepatobiliary: No focal liver abnormality is seen. Status post cholecystectomy. No biliary dilatation. Pancreas: Unremarkable Spleen: Unremarkable Adrenals/Urinary Tract: The adrenal glands are unremarkable. The kidneys are normal in position. Moderate bilateral renal cortical atrophy. And exophytic 20 mm lesion is seen arising from the interpolar region of the right kidney that was better assessed on prior sonogram of 07/26/2021 and is compatible with a hyperdense cortical cyst. Smaller simple cortical cyst arises from the upper pole the right kidney. No follow-up imaging is recommended for these lesions. The kidneys are otherwise unremarkable. The bladder is unremarkable. Stomach/Bowel: Stomach is within normal limits. Appendix appears normal. No evidence of bowel wall thickening, distention, or  inflammatory changes. Vascular/Lymphatic: Aortic atherosclerosis. No enlarged abdominal or pelvic lymph nodes. Reproductive: Status post hysterectomy. No adnexal masses. Other: No abdominal wall hernia or abnormality. No abdominopelvic ascites. Musculoskeletal: Remote healed fracture deformity of the left ischium. Remote severe anterior wedge compression deformity of L1 with 80% loss of height and minimal retropulsion of the posterosuperior aspect of the vertebral body by 3 mm. Remote superior endplate fracture of L3. Degenerative changes are seen within the lumbar spine. Bilateral laminectomy and posterior decompression of L5-S1. IMPRESSION: 1. No acute intra-abdominal pathology identified. No definite radiographic explanation for  the patient's reported symptoms. 2. Moderate bilateral renal cortical atrophy. 3. Remote severe anterior wedge compression deformity of L1 with 80% loss of height and minimal retropulsion of the posterosuperior aspect of the vertebral body by 3 mm. Remote superior endplate fracture of L3. Remote healed fracture deformity of the left ischium. No acute bone abnormality. Aortic Atherosclerosis (ICD10-I70.0). Electronically Signed   By: Helyn Numbers M.D.   On: 09/15/2022 21:17   VAS [place] LOWER EXTREMITY VENOUS (DVT)  Result Date: 09/15/2022  Lower Venous DVT Study Patient Name:  NYCHELLE ZABLOCKI  Date of Exam:   09/15/2022 Medical Rec #: [id number]        Accession #:    [id number] Date of Birth: June 21, 1940        Patient Gender: F Patient Age:   35 years Exam Location:  [facility] Procedure:      VAS [place] LOWER EXTREMITY VENOUS (DVT) Referring Phys: Enid Derry HALL --------------------------------------------------------------------------------  Indications: Elevated D-Dimer, and Pain.  Limitations: Body habitus and Edema. Comparison Study: No prior study on file Performing Technologist: Sherren Kerns RVS  Examination Guidelines: A complete evaluation includes B-mode imaging, spectral  Doppler, color Doppler, and power Doppler as needed of all accessible portions of each vessel. Bilateral testing is considered an integral part of a complete examination. Limited examinations for reoccurring indications may be performed as noted. The reflux portion of the exam is performed with the patient in reverse Trendelenburg.  +---------+---------------+---------+-----------+----------+-------------------+ RIGHT    CompressibilityPhasicitySpontaneityPropertiesThrombus Aging      +---------+---------------+---------+-----------+----------+-------------------+ CFV      Full           Yes      Yes                                      +---------+---------------+---------+-----------+----------+-------------------+ SFJ      Full                                                             +---------+---------------+---------+-----------+----------+-------------------+ FV Prox  Full                                                             +---------+---------------+---------+-----------+----------+-------------------+ FV Mid   Full           Yes      Yes                                      +---------+---------------+---------+-----------+----------+-------------------+ FV DistalFull                                                             +---------+---------------+---------+-----------+----------+-------------------+ PFV      Full                                                             +---------+---------------+---------+-----------+----------+-------------------+  POP      Full           Yes      Yes                                      +---------+---------------+---------+-----------+----------+-------------------+ PTV                                                   Not well visualized +---------+---------------+---------+-----------+----------+-------------------+ PERO                                                  Not well  visualized +---------+---------------+---------+-----------+----------+-------------------+   +---------+---------------+---------+-----------+----------+-------------------+ LEFT     CompressibilityPhasicitySpontaneityPropertiesThrombus Aging      +---------+---------------+---------+-----------+----------+-------------------+ CFV      Full           Yes      Yes                                      +---------+---------------+---------+-----------+----------+-------------------+ SFJ      Full                                                             +---------+---------------+---------+-----------+----------+-------------------+ FV Prox  Full                                                             +---------+---------------+---------+-----------+----------+-------------------+ FV Mid   Full           Yes      Yes                                      +---------+---------------+---------+-----------+----------+-------------------+ FV DistalFull                                                             +---------+---------------+---------+-----------+----------+-------------------+ PFV      Full                                                             +---------+---------------+---------+-----------+----------+-------------------+ POP      Full           Yes      Yes                                      +---------+---------------+---------+-----------+----------+-------------------+  PTV                                                   Not well visualized +---------+---------------+---------+-----------+----------+-------------------+ PERO                                                  Not well visualized +---------+---------------+---------+-----------+----------+-------------------+     Summary: RIGHT: - There is no evidence of deep vein thrombosis in the lower extremity. However, portions of this examination were limited- see  technologist comments above.  - No cystic structure found in the popliteal fossa.  LEFT: - There is no evidence of deep vein thrombosis in the lower extremity. However, portions of this examination were limited- see technologist comments above.  - No cystic structure found in the popliteal fossa.  *See table(s) above for measurements and observations. Electronically signed by Coral Else MD on 09/15/2022 at 3:04:49 PM.    Final    DG Chest Port 1 View  Result Date: 09/14/2022 CLINICAL DATA:  Chest pain, right hand and shoulder pain EXAM: PORTABLE CHEST 1 VIEW COMPARISON:  01/15/2015 FINDINGS: Single frontal view of the chest demonstrates a stable cardiac silhouette. No airspace disease, effusion, or pneumothorax. No acute bony abnormalities. IMPRESSION: 1. No acute intrathoracic process. Electronically Signed   By: Sharlet Salina M.D.   On: 09/14/2022 22:49   DG Shoulder Right  Result Date: 09/14/2022 CLINICAL DATA:  Right shoulder pain, pain with movement EXAM: RIGHT SHOULDER - 2+ VIEW COMPARISON:  None Available. FINDINGS: Internal rotation, external rotation, and transscapular views of the right shoulder are obtained. No acute displaced fracture, subluxation, or dislocation. Moderate hypertrophic changes are seen of the acromioclavicular joint. Mild spurring of the undersurface of the acromion process. Mild glenohumeral joint osteoarthritis. Soft tissues are unremarkable. Right chest is clear. IMPRESSION: 1. Moderate acromioclavicular joint osteoarthropathy. 2. Mild glenohumeral joint osteoarthritis. 3. No acute displaced fracture. Electronically Signed   By: Sharlet Salina M.D.   On: 09/14/2022 18:35    Microbiology: Results for orders placed or performed during the hospital encounter of 09/14/22  MRSA Next Gen by PCR, Nasal     Status: None   Collection Time: 09/15/22  2:39 AM   Specimen: Nasal Mucosa; Nasal Swab  Result Value Ref Range Status   MRSA by PCR Next Gen NOT DETECTED NOT DETECTED Final     Comment: (NOTE) The GeneXpert MRSA Assay (FDA approved for NASAL specimens only), is one component of a comprehensive MRSA colonization surveillance program. It is not intended to diagnose MRSA infection nor to guide or monitor treatment for MRSA infections. Test performance is not FDA approved in patients less than 24 years old. Performed at [facility], [address], [place], [place] [address]     Labs: CBC: Recent Labs  Lab 09/14/22 2125 09/15/22 1420 09/15/22 2109 09/16/22 0953 09/17/22 0016 09/18/22 0016  WBC 7.0 5.4  --  4.2 4.3 4.3  HGB 6.9* 7.7* 7.7* 7.0* 9.5* 10.5*  HCT 23.2* 25.9* 25.9* 21.5* 30.3* 33.0*  MCV 99.1 98.5  --  93.1 93.8 94.0  PLT 224 216  --  182 204 201   Basic Metabolic Panel: Recent Labs  Lab 09/14/22 2125  09/15/22 0807 09/16/22 0953 09/16/22 1949 09/17/22 0016  NA 142 142 139 137 138  K 4.0 3.9 4.0 3.8 3.9  CL 116* 115* 113* 112* 112*  CO2 19* 18* 18* 18* 19*  GLUCOSE 99 114* 80 109* 81  BUN 18 14 10 11 11   CREATININE 1.76* 1.61* 1.45* 1.44* 1.38*  CALCIUM 8.5* 8.3* 8.3* 8.7* 8.5*  MG  --  1.5* 2.2  --   --   PHOS  --  2.7  --   --   --    Liver Function Tests: Recent Labs  Lab 09/15/22 0807  AST 15  ALT 11  ALKPHOS 67  BILITOT 0.5  PROT 5.8*  ALBUMIN 3.2*   CBG: No results for input(s): "GLUCAP" in the last 168 hours.  Discharge time spent: 42 min  Signed: Kathlen Mody, MD [facility]

## 2022-09-19 LAB — SURGICAL PATHOLOGY

## 2022-09-19 LAB — PROTEIN C, TOTAL: Protein C, Total: 108 % (ref 60–150)

## 2022-09-20 LAB — CARDIOLIPIN ANTIBODIES, IGG, IGM, IGA
Anticardiolipin IgA: <9 APL U/mL (ref 0–11)
Anticardiolipin IgG: <9 GPL U/mL (ref 0–14)
Anticardiolipin IgM: 12 MPL U/mL (ref 0–12)

## 2022-09-21 ENCOUNTER — Encounter: Payer: Self-pay | Admitting: Internal Medicine

## 2022-09-21 LAB — BETA-2-GLYCOPROTEIN I ABS, IGG/M/A
Beta-2 Glyco I IgG: <9 GPI IgG units (ref 0–20)
Beta-2-Glycoprotein I IgA: <9 GPI IgA units (ref 0–25)
Beta-2-Glycoprotein I IgM: >150 GPI IgM units — ABNORMAL HIGH (ref 0–32)

## 2022-09-23 ENCOUNTER — Encounter (HOSPITAL_COMMUNITY): Payer: Self-pay | Admitting: Internal Medicine

## 2022-10-05 LAB — FACTOR 5 LEIDEN

## 2022-10-08 LAB — PROTHROMBIN GENE MUTATION
